# Patient Record
Sex: Female | Born: 1985
Health system: Southern US, Community
[De-identification: ages and names within clinical notes are randomized; demographics above are authoritative.]

## PROBLEM LIST (undated history)

## (undated) DIAGNOSIS — Z800 Family history of malignant neoplasm of digestive organs: Secondary | ICD-10-CM

## (undated) DIAGNOSIS — F329 Major depressive disorder, single episode, unspecified: Secondary | ICD-10-CM

## (undated) DIAGNOSIS — F32A Depression, unspecified: Secondary | ICD-10-CM

## (undated) DIAGNOSIS — Z8041 Family history of malignant neoplasm of ovary: Secondary | ICD-10-CM

## (undated) HISTORY — PX: COMBINED HYSTEROSCOPY DIAGNOSTIC / D&C: SUR297

## (undated) HISTORY — DX: Family history of malignant neoplasm of ovary: Z80.41

## (undated) HISTORY — PX: TUBAL LIGATION: SHX77

## (undated) HISTORY — PX: ABDOMINAL HYSTERECTOMY: SHX81

## (undated) HISTORY — PX: CHOLECYSTECTOMY: SHX55

## (undated) HISTORY — DX: Family history of malignant neoplasm of digestive organs: Z80.0

---
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
## 2011-07-08 ENCOUNTER — Emergency Department: Payer: Self-pay | Admitting: *Deleted

## 2011-11-20 ENCOUNTER — Observation Stay: Payer: Self-pay

## 2011-11-20 LAB — URINALYSIS, COMPLETE
Glucose,UR: 50 mg/dL (ref 0–75)
Specific Gravity: 1.019 (ref 1.003–1.030)

## 2011-11-21 LAB — URINE CULTURE

## 2011-11-29 ENCOUNTER — Inpatient Hospital Stay: Payer: Self-pay | Admitting: Obstetrics and Gynecology

## 2011-11-29 LAB — CBC WITH DIFFERENTIAL/PLATELET
Basophil #: 0.1 10*3/uL (ref 0.0–0.1)
Eosinophil #: 0.2 10*3/uL (ref 0.0–0.7)
Eosinophil %: 1.2 %
HCT: 38.3 % (ref 35.0–47.0)
HGB: 13.1 g/dL (ref 12.0–16.0)
Lymphocyte #: 1.7 10*3/uL (ref 1.0–3.6)
Lymphocyte %: 13.3 %
MCV: 86 fL (ref 80–100)
Monocyte #: 0.9 x10 3/mm (ref 0.2–0.9)
Monocyte %: 6.9 %
Neutrophil %: 78.1 %
RDW: 13.4 % (ref 11.5–14.5)
WBC: 12.5 10*3/uL — ABNORMAL HIGH (ref 3.6–11.0)

## 2011-11-30 LAB — HEMATOCRIT: HCT: 27.4 % — ABNORMAL LOW (ref 35.0–47.0)

## 2014-02-17 ENCOUNTER — Ambulatory Visit: Payer: Self-pay | Admitting: Obstetrics and Gynecology

## 2014-02-17 LAB — BASIC METABOLIC PANEL
ANION GAP: 7 (ref 7–16)
BUN: 14 mg/dL (ref 7–18)
CO2: 24 mmol/L (ref 21–32)
Calcium, Total: 8.6 mg/dL (ref 8.5–10.1)
Chloride: 106 mmol/L (ref 98–107)
Creatinine: 0.66 mg/dL (ref 0.60–1.30)
EGFR (African American): >60
Glucose: 79 mg/dL (ref 65–99)
OSMOLALITY: 273 (ref 275–301)
POTASSIUM: 3.8 mmol/L (ref 3.5–5.1)
SODIUM: 137 mmol/L (ref 136–145)

## 2014-02-17 LAB — HEMOGLOBIN: HGB: 14 g/dL (ref 12.0–16.0)

## 2014-02-23 ENCOUNTER — Ambulatory Visit: Payer: Self-pay | Admitting: Obstetrics and Gynecology

## 2014-02-23 LAB — HEMATOCRIT: HCT: 30.7 % — AB (ref 35.0–47.0)

## 2014-02-24 LAB — BASIC METABOLIC PANEL
ANION GAP: 9 (ref 7–16)
BUN: 15 mg/dL (ref 7–18)
CREATININE: 0.8 mg/dL (ref 0.60–1.30)
Calcium, Total: 7.6 mg/dL — ABNORMAL LOW (ref 8.5–10.1)
Chloride: 103 mmol/L (ref 98–107)
Co2: 25 mmol/L (ref 21–32)
EGFR (African American): >60
EGFR (Non-African Amer.): >60
Glucose: 152 mg/dL — ABNORMAL HIGH (ref 65–99)
Osmolality: 278 (ref 275–301)
Potassium: 4.4 mmol/L (ref 3.5–5.1)
Sodium: 137 mmol/L (ref 136–145)

## 2014-02-24 LAB — HEMATOCRIT: HCT: 28.1 % — ABNORMAL LOW (ref 35.0–47.0)

## 2014-02-26 LAB — PATHOLOGY REPORT

## 2014-02-27 ENCOUNTER — Inpatient Hospital Stay: Payer: Self-pay | Admitting: Obstetrics and Gynecology

## 2014-02-27 LAB — COMPREHENSIVE METABOLIC PANEL
ALBUMIN: 3.6 g/dL (ref 3.4–5.0)
ALK PHOS: 73 U/L
ANION GAP: 9 (ref 7–16)
BILIRUBIN TOTAL: 0.7 mg/dL (ref 0.2–1.0)
BUN: 8 mg/dL (ref 7–18)
CALCIUM: 8.5 mg/dL (ref 8.5–10.1)
CO2: 27 mmol/L (ref 21–32)
CREATININE: 0.68 mg/dL (ref 0.60–1.30)
Chloride: 103 mmol/L (ref 98–107)
EGFR (African American): >60
EGFR (Non-African Amer.): >60
Glucose: 125 mg/dL — ABNORMAL HIGH (ref 65–99)
OSMOLALITY: 277 (ref 275–301)
Potassium: 3.6 mmol/L (ref 3.5–5.1)
SGOT(AST): 29 U/L (ref 15–37)
SGPT (ALT): 39 U/L
Sodium: 139 mmol/L (ref 136–145)
TOTAL PROTEIN: 6.8 g/dL (ref 6.4–8.2)

## 2014-02-27 LAB — URINALYSIS, COMPLETE
BACTERIA: NONE SEEN
BLOOD: NEGATIVE
Bilirubin,UR: NEGATIVE
GLUCOSE, UR: NEGATIVE mg/dL (ref 0–75)
Ketone: NEGATIVE
LEUKOCYTE ESTERASE: NEGATIVE
NITRITE: NEGATIVE
Ph: 7 (ref 4.5–8.0)
Protein: NEGATIVE
Specific Gravity: 1.017 (ref 1.003–1.030)
Squamous Epithelial: <1
WBC UR: <1 /HPF (ref 0–5)

## 2014-02-27 LAB — CBC WITH DIFFERENTIAL/PLATELET
BASOS ABS: 0 10*3/uL (ref 0.0–0.1)
BASOS PCT: 0.4 %
Basophil #: 0 10*3/uL (ref 0.0–0.1)
Basophil %: 0.4 %
EOS ABS: 0.1 10*3/uL (ref 0.0–0.7)
EOS ABS: 0 10*3/uL (ref 0.0–0.7)
EOS PCT: 1.6 %
Eosinophil %: 0.4 %
HCT: 24.5 % — ABNORMAL LOW (ref 35.0–47.0)
HCT: 23.4 % — AB (ref 35.0–47.0)
HGB: 8.2 g/dL — AB (ref 12.0–16.0)
HGB: 8 g/dL — ABNORMAL LOW (ref 12.0–16.0)
Lymphocyte #: 0.6 10*3/uL — ABNORMAL LOW (ref 1.0–3.6)
Lymphocyte #: 1.1 10*3/uL (ref 1.0–3.6)
Lymphocyte %: 6.5 %
Lymphocyte %: 10.8 %
MCH: 29.9 pg (ref 26.0–34.0)
MCH: 30.4 pg (ref 26.0–34.0)
MCHC: 33.7 g/dL (ref 32.0–36.0)
MCHC: 34.3 g/dL (ref 32.0–36.0)
MCV: 89 fL (ref 80–100)
MCV: 88 fL (ref 80–100)
MONO ABS: 1 x10 3/mm — AB (ref 0.2–0.9)
Monocyte #: 0.8 x10 3/mm (ref 0.2–0.9)
Monocyte %: 8.4 %
Monocyte %: 9.5 %
NEUTROS PCT: 78.9 %
Neutrophil #: 7.5 10*3/uL — ABNORMAL HIGH (ref 1.4–6.5)
Neutrophil #: 8.1 10*3/uL — ABNORMAL HIGH (ref 1.4–6.5)
Neutrophil %: 83.1 %
PLATELETS: 285 10*3/uL (ref 150–440)
Platelet: 262 10*3/uL (ref 150–440)
RBC: 2.75 10*6/uL — ABNORMAL LOW (ref 3.80–5.20)
RBC: 2.65 10*6/uL — ABNORMAL LOW (ref 3.80–5.20)
RDW: 12.9 % (ref 11.5–14.5)
RDW: 12.7 % (ref 11.5–14.5)
WBC: 9.1 10*3/uL (ref 3.6–11.0)
WBC: 10.2 10*3/uL (ref 3.6–11.0)

## 2014-02-27 LAB — PROTIME-INR
INR: 1
Prothrombin Time: 13.3 secs (ref 11.5–14.7)

## 2014-02-27 LAB — APTT: ACTIVATED PTT: 26.6 s (ref 23.6–35.9)

## 2014-02-27 LAB — PHOSPHORUS: Phosphorus: 0.9 mg/dL — CL (ref 2.5–4.9)

## 2014-02-27 LAB — LIPASE, BLOOD: LIPASE: 99 U/L (ref 73–393)

## 2014-02-27 LAB — TROPONIN I: Troponin-I: <0.02 ng/mL

## 2014-02-27 LAB — MAGNESIUM: MAGNESIUM: 1.4 mg/dL — AB

## 2014-02-28 LAB — BASIC METABOLIC PANEL
ANION GAP: 4 — AB (ref 7–16)
BUN: 8 mg/dL (ref 7–18)
CALCIUM: 8.2 mg/dL — AB (ref 8.5–10.1)
CREATININE: 0.66 mg/dL (ref 0.60–1.30)
Chloride: 109 mmol/L — ABNORMAL HIGH (ref 98–107)
Co2: 26 mmol/L (ref 21–32)
Glucose: 131 mg/dL — ABNORMAL HIGH (ref 65–99)
Osmolality: 278 (ref 275–301)
Potassium: 4.2 mmol/L (ref 3.5–5.1)
Sodium: 139 mmol/L (ref 136–145)

## 2014-02-28 LAB — CBC WITH DIFFERENTIAL/PLATELET
Basophil #: 0 10*3/uL (ref 0.0–0.1)
Basophil %: 0.1 %
Eosinophil #: 0 10*3/uL (ref 0.0–0.7)
Eosinophil %: 0.1 %
HCT: 25 % — AB (ref 35.0–47.0)
HGB: 8.7 g/dL — ABNORMAL LOW (ref 12.0–16.0)
LYMPHS ABS: 0.6 10*3/uL — AB (ref 1.0–3.6)
LYMPHS PCT: 6.4 %
MCH: 30.5 pg (ref 26.0–34.0)
MCHC: 34.6 g/dL (ref 32.0–36.0)
MCV: 88 fL (ref 80–100)
MONOS PCT: 8.1 %
Monocyte #: 0.8 x10 3/mm (ref 0.2–0.9)
NEUTROS ABS: 8.7 10*3/uL — AB (ref 1.4–6.5)
Neutrophil %: 85.3 %
PLATELETS: 277 10*3/uL (ref 150–440)
RBC: 2.84 10*6/uL — AB (ref 3.80–5.20)
RDW: 13.1 % (ref 11.5–14.5)
WBC: 10.2 10*3/uL (ref 3.6–11.0)

## 2014-02-28 LAB — PHOSPHORUS: Phosphorus: 2.7 mg/dL (ref 2.5–4.9)

## 2014-02-28 LAB — MAGNESIUM: Magnesium: 2.1 mg/dL

## 2014-03-01 LAB — BASIC METABOLIC PANEL
Anion Gap: 6 — ABNORMAL LOW (ref 7–16)
BUN: 10 mg/dL (ref 7–18)
Calcium, Total: 7.9 mg/dL — ABNORMAL LOW (ref 8.5–10.1)
Chloride: 108 mmol/L — ABNORMAL HIGH (ref 98–107)
Co2: 27 mmol/L (ref 21–32)
Creatinine: 0.75 mg/dL (ref 0.60–1.30)
EGFR (Non-African Amer.): >60
Glucose: 96 mg/dL (ref 65–99)
Osmolality: 280 (ref 275–301)
Potassium: 4 mmol/L (ref 3.5–5.1)
Sodium: 141 mmol/L (ref 136–145)

## 2014-03-01 LAB — CBC WITH DIFFERENTIAL/PLATELET
Basophil #: 0 10*3/uL (ref 0.0–0.1)
Basophil %: 0.5 %
Eosinophil #: 0.5 10*3/uL (ref 0.0–0.7)
Eosinophil %: 5.8 %
HCT: 23.3 % — ABNORMAL LOW (ref 35.0–47.0)
HGB: 7.9 g/dL — ABNORMAL LOW (ref 12.0–16.0)
Lymphocyte #: 2.1 10*3/uL (ref 1.0–3.6)
Lymphocyte %: 24.8 %
MCH: 30.3 pg (ref 26.0–34.0)
MCHC: 34.1 g/dL (ref 32.0–36.0)
MCV: 89 fL (ref 80–100)
MONOS PCT: 10.4 %
Monocyte #: 0.9 x10 3/mm (ref 0.2–0.9)
NEUTROS ABS: 5 10*3/uL (ref 1.4–6.5)
Neutrophil %: 58.5 %
Platelet: 252 10*3/uL (ref 150–440)
RBC: 2.62 10*6/uL — AB (ref 3.80–5.20)
RDW: 13.6 % (ref 11.5–14.5)
WBC: 8.6 10*3/uL (ref 3.6–11.0)

## 2014-03-01 LAB — URINE CULTURE

## 2014-03-04 LAB — CULTURE, BLOOD (SINGLE)

## 2014-11-21 NOTE — Op Note (Signed)
PATIENT NAME:  Sharon Andrade, Sharon Andrade MR#:  [id number] DATE OF BIRTH:  Oct 05, 1985  DATE OF PROCEDURE:  02/27/2014  PREOPERATIVE DIAGNOSES:  Postoperative hemoperitoneum.   POSTOPERATIVE DIAGNOSES:  Postoperative hemoperitoneum.   OPERATION PERFORMED: Diagnostic laparoscopy evacuation of hemoperitoneum and the administration of hemostatic agent along prior hysterectomy pedicles.   ANESTHESIA USED: General.   PRIMARY SURGEON: Vena Austria, MD.   ESTIMATED BLOOD LOSS: Minimal blood loss intraoperatively but evacuation of 750 mL of hemoperitoneum.  OPERATIVE FLUIDS: 1 liter of crystalloid.   COMPLICATIONS: None.   PREOPERATIVE ANTIBIOTICS: None.   INTRAOPERATIVE FINDINGS: Large amount of hemoperitoneum noted within the pelvis. This appeared to be old and was already coagulated.  Following evacuation of the hemoperitoneum, the vaginal cuff was inspected both vaginally and abdominally and noted to be intact with no active bleeding, and also no signs of infection. The pedicles on the left and right sidewall were inspected and also noted to be hemostatic. The pelvis was copiously irrigated and 1 gram of Arista powder was applied to all pedicles to assure continued hemostasis.   SPECIMENS REMOVED: None.   CONDITION FOLLOWING PROCEDURE: Stable.   PROCEDURE IN DETAIL: Risks, benefits, and alternatives of the procedure were discussed with the patient prior to proceeding to the operating room. The patient had initially presented with tachycardia and fever to 103 to the Emergency Department 4 days following uncomplicated vaginal hysterectomy.  She had not noticed any bleeding vaginally, but CT scan of the abdomen and pelvis was consistent with hemoperitoneum and could not exclude active bleeding.  After  discussion of management and options, the patient initially opted for conservative management with serial CBCs; however, her 2nd CBC was unable to drawn and there was some concern that she was clamping  down peripherally and did have active bleeding which prompted proceeding to the OR for evaluation of the pedicles. Upon taking the patient to the OR, she was placed under general endotracheal anesthesia, prepped and draped in the usual sterile fashion and positioned in the dorsal lithotomy position in [place] stirrups. A timeout was performed.   Attention was turned to the patient's pelvis. The bladder was already catheterized with an indwelling Foley catheter. An operative speculum was placed and the cervical cuff was inspected vaginally, noted to be intact with no evidence of purulent drainage or bleeding. A sponge stick was then placed in the vagina to allow manipulation of the vaginal cuff when doing the inspection for the abdominal side.   Attention was turned to the patient's abdomen. The left upper quadrant was injected with 0.5% Sensorcaine at Palmer's point. A stab incision was made with the scalpel and a 5 mm XL trocar was used to gain entry into the peritoneal cavity under direct visualization. Pneumoperitoneum was established. A second 5 mm assistant port was placed in the left lower quadrant followed by a 5 mm umbilical trocar.  A suction irrigator was then used to evacuate the previously noted hemoperitoneum on CT scan. The blood was noted to be old and previously coagulated. There was no active bleeding visualized at any point during the case.   Upon evacuation of the hemoperitoneum, all pedicles were closely inspected. The ureters were traced through the course and the pelvis noted to be peristalsing appropriately. Even though there was no active bleeding from the cuff or the pedicles, 1 gram of Arista powder was applied after copiously irrigating the pelvis with normal saline. Pneumoperitoneum was then evacuated and the trocars were removed under direct visualization. The incision sites were  dressed with Dermabond. Sponge, needle, and instrument counts were correct x2. The patient tolerated the  procedure well and was taken to the recovery room in stable condition.     ____________________________ Florina OuAndreas M. Bonney AidStaebler, MD ams:ds D: 03/03/2014 20:10:44 ET T: 03/03/2014 20:45:45 ET JOB#: [id number]  cc: Florina OuAndreas M. Bonney AidStaebler, MD, <Dictator> Carmel SacramentoANDREAS Cathrine MusterM Yair Dusza MD ELECTRONICALLY SIGNED 03/12/2014 11:41

## 2014-11-21 NOTE — Consult Note (Signed)
Consulting Department: Emergency Department Consulting Physician: Loleta RoseForbach, Cory MD  Consulting Question: Postop fever  History of Present Illness: 29 year old [phone] presenting postoperative day 4 following uncomplicated vaginal hysterectomy secondary for previously failed endometrial ablation with fevers and generalized malaise.  States started feeling off yesterday, but this morning awoke with fevers, chills, suprapubic pain and pressure worse with voiding.  She has not noted any associated change in urine color, odor, or clarity, specifically denies hematuria.  No rectal pressure.  Denies nausea or emesis, stooling normally.  Reportedly febrile up to 102.  Has not noted any foul smelling vaginal discharge, increased bleeding. No CP, no SOB  Review of Systems: 10 point review of systems negative unless note in HPI Medical History:DepressionPreeclampsia  Past Surgical History: Total vaginal hysterectomy 07/27/2015Novasure endometrial ablation 2013D&C possible prior salpingectomy secondary to ectopicBTL 11/30/2012  Obstetric History : [id number], reports several 1st trimester SAB's and 1 ectopic, two term vaginal delivereis  Gynecologic History: denies history of STI, s/p vaginal hysterectomy as detailed above  Family History: non-contributory  Social History: Denies tobacco, EtOH, or illicit drug use  Allergies: PCN  Medications:Colace 100mg  po bidNaprosyn 500mg  po q12hrs prn painNorco 5/325 1 tab po q4-hrs prn pain  Physical Exam:98.6; BP 117/77; HR 151, RR 18, O2sat 100% RANADnomocephalic, anictericRegular rate and rythmCTABNABS, soft, non-distendended, moderate suprapubic tenderness, no rebound, no guardingdeferredno edema, erythemaCN II-XII grossly intactmood appropriate, affect full WBC 9.1K, H&H 8.2 & 24.5, platelets 262KNa 139, K 3.6, Cl 103, CO2 27, BUN 8, Cr 0.68, BG 125, AT 29, ALT 39, Mg 1.499Acid 1.8pH 7.53, PCO2 30, PO2 101pending  Assessment: 29 yo [id number] POD#4 TVH presenting  with likely sepsis  Plan:Sepsis - suspect urinary in origin vs vaginal cuff hematoma/abscess.  Normal BUN/Cr, WBC, and exam reassuring making bowl injury or urinary tract injury unlikleygiven PCN allergy will cover with aztreonam and clindamycinBlood and urine cultures pendingCT scan abdomen and pelvis pending FEN - regular diet, D5 1/2NS at 16250mL/hr Pain control - percocet & ibuprofen prn DVT ppx - SCD's Disposition - pending 24-hr afebrile, results of cultures and imaging will determine type and duration of po regimen   Electronic Signatures: Lorrene ReidStaebler, Yareth Macdonnell M (MD) (Signed on 31-Jul-15 17:11)  Authored   Last Updated: 31-Jul-15 17:51 by Lorrene ReidStaebler, Ankush Gintz M (MD)

## 2014-11-21 NOTE — Op Note (Signed)
PATIENT NAME:  Sharon Andrade, Sharon MR#:  [id number] DATE OF BIRTH:  1986-03-07  DATE OF PROCEDURE:  02/23/2014  PREOPERATIVE DIAGNOSIS: Menorrhagia, failed NovaSure endometrial ablation.   POSTOPERATIVE DIAGNOSIS: Menorrhagia, failed NovaSure endometrial ablation.   PROCEDURE: 1.  Total vaginal hysterectomy.  2.  Left distal salpingectomy.  ANESTHESIA: General endotracheal anesthesia.  SURGEON: Suzy Bouchardhomas J. Schermerhorn, MD  INDICATIONS: This is a 29 year old gravida 7, para 2. Patient underwent a failed NovaSure ablation in 2013, continues to have heavy menorrhagia and dysmenorrhea.   PROCEDURE: After adequate general endotracheal anesthesia, the patient was placed in the dorsal supine position with the legs in the candy-cane stirrups. The patient was prepped and draped in sterile fashion. She did receive 2 g IV cefoxitin prior to commencement of the case. The patient's bladder was catheterized, yielding 250 mL of clear urine. A weighted speculum was placed in the posterior vaginal vault and the cervix was grasped with 2 thyroid tenacula. The cervix was circumferentially injected with 1% lidocaine with 1:100,000 epinephrine. A direct posterior colpotomy incision was made. Upon entry into the posterior cul-de-sac, a long-billed weighted speculum was placed. The uterosacral ligaments were bilaterally clamped, transected, suture ligated with 0 Vicryl suture. Bovie was used to circumferentially incise the anterior portion of the cervix. The anterior cul-de-sac was entered sharply without difficulty and the Deaver retractor was placed within the anterior cul-de-sac to reflect the bladder anteriorly. The cardinal ligaments were bilaterally clamped, transected, suture ligated with 0 Vicryl suture. The uterine arteries were bilaterally clamped, transected, suture ligated with 0 Vicryl suture. Sequential clamps in the broad ligament were performed, and the cornua were ultimately bilaterally clamped, transected,  doubly ligated with 0 Vicryl suture. Good hemostasis was noted. Ovaries appeared normal. There was no identifiable right fallopian tube. Left fallopian tube was identified with a distal portion approximately 1.5 cm. This was clamped, removed, and suture ligated. After assuring good hemostasis, the peritoneum was closed with a pursestring 2-0 PDS suture and the cuff was then closed with a running 0 Vicryl suture. Good approximation of tissue, good hemostasis noted. Sponge and needle count were correct. There were no complications. Estimated blood loss 50 mL. Intraoperative fluids 800 mL. Foley catheter was placed at the end of the case. Urine output prior was 250 mL. Patient was taken to the recovery room in good condition.    ___________________________ Suzy Bouchardhomas J. Schermerhorn, MD tjs:sk D: 02/23/2014 11:55:35 ET T: 02/23/2014 21:35:07 ET JOB#: [id number]  cc: Suzy Bouchardhomas J. Schermerhorn, MD, <Dictator> Suzy BouchardHOMAS J SCHERMERHORN MD ELECTRONICALLY SIGNED 03/02/2014 18:27

## 2014-11-22 NOTE — Op Note (Signed)
PATIENT NAME:  Sharon SitesJOHNSON, [name]#:  [id number] DATE OF BIRTH:  25-Oct-1985  DATE OF PROCEDURE:  12/01/2011  PREOPERATIVE DIAGNOSIS: Elective permanent sterilization.   POSTOPERATIVE DIAGNOSIS: Elective permanent sterilization.   PROCEDURE: Bilateral tubal ligation, [place].   SURGEON:  Beverly Gust. Talya Quain, MD  ANESTHESIA:  General endotracheal anesthesia.  INDICATIONS: This is a 29 year old gravida 2, now para 2 patient with uncomplicated spontaneous vaginal delivery the day before. The patient has elected for permanent sterilization. Medicaid consent form was signed by the patient on 10/31/2011. The patient reconfirms the desire for permanent sterilization. She is aware of the failure rate of one per 200 per year.   PROCEDURE: After adequate general endotracheal anesthesia, the patient was placed in the dorsal supine position. The patient's abdomen was prepped and draped in normal sterile fashion. The skin was injected with 0.5% Marcaine without epinephrine. A 15-mm infraumbilical incision was made. Sharp dissection was used to identify the fascia. The fascia was opened sharply and peritoneum was opened sharply. The right fallopian tube was grasped with a Babcock clamp and after visualizing the fimbriated end, two separate 0 plain gut sutures were applied in the mid portion of the fallopian tube. A 1.5-cm portion of the fallopian tube was removed. Good hemostasis was noted. A similar procedure was repeated on the patient's left fallopian tube. After visualizing the fimbriated end, two separate 0 plain gut sutures were applied. A 1.5-cm portion of the fallopian tube was removed. Good hemostasis was noted. The patient was flattened out and the fascia was closed with a 2-0 Vicryl suture running, nonlocking, with good approximation of edges. The skin was reapproximated with interrupted 4-0 Vicryl suture. Sterile dressing applied. No complications. Estimated blood loss minimal. The patient was taken to the  recovery room in good condition.  ____________________________ [name], MD tjs:bjt D:  12/01/2011 10:14:54 ET          T: 12/01/2011 11:18:16 ET         JOB#: [id number] Suzy BouchardHOMAS J Raejean Swinford MD ELECTRONICALLY SIGNED 12/04/2011 9:42

## 2014-12-08 NOTE — H&P (Signed)
L&D Evaluation:  History:   HPI 29yo G2P1001 with LMP of 03/02/11 & EDD of 12/28/11 here for "PPROM of clear fluid at 0630am leaking from vagina this am" Fluid visualized clear with exam of 3/90/vtx-2.  [facility]x of pre-[facility]cl[facility], pp hemorrhage, perineal repair in the OR after last delivery due to blood transfusion, Obese, depression hx, Wheezing hx in the past with Albuterol inhaler. Pt was offerred consideration for a LTCS due to extensive pp repair and hemorrhage and blood transfusion but, disc with pt and partner and they have elected due to pt being at risk for the same issue.    Presents with leaking fluid    Patient's Medical History Depression, Obese, PP hemorrhage with blood transfusion    Patient's Surgical History Perineal repair in the OR, cholestectomy    Medications Pre Natal Vitamins    Allergies PCN    Social History none    Family History Non-Contributory   ROS:   ROS All systems were reviewed.  HEENT, CNS, GI, GU, Respiratory, CV, Renal and Musculoskeletal systems were found to be normal.   Exam:   General no apparent distress    Mental Status clear    Chest clear    Heart normal sinus rhythm, no murmur/gallop/rubs    Abdomen gravid, non-tender    Estimated Fetal Weight Average for gestational age    Fetal Position vtx    Back no CVAT    Edema 1+    Reflexes 1+    Clonus negative    Pelvic 3/100/vtx-2    Mebranes Ruptured, PPROM    Description clear    FHT normal rate with no decels, accels present    Ucx absent    Skin dry    Lymph no lymphadenopathy   Impression:   Impression IUP nat 35 5/7 weeks with PPROM   Plan:   Plan antibiotics for GBBS prophylaxis, Admit for delivery.    Comments Disc with Dr Logan BoresEvans and Dr. Feliberto GottronSchermerhorn regarding IOL with Pitocin for a vaginal delivery vs offerring LTCS due to previous hx of pp hemorrhage and perineal repair in the OR. Pt accepts the risks, benefits and alternatives and  is ok to proceed for a vaginal delivery. Disc Pitocin vs waiting for labor S/S and pt consents for labor induction. Will start Pitocin per protocol.   Electronic Signatures: Sharee PimpleJones, Avyonna Wagoner W (CNM)  (Signed 01-May-13 10:56)  Authored: L&D Evaluation   Last Updated: 01-May-13 10:56 by Sharee PimpleJones, Takyah Ciaramitaro W (CNM)

## 2017-12-02 ENCOUNTER — Encounter: Payer: Self-pay | Admitting: Family Medicine

## 2017-12-03 NOTE — Telephone Encounter (Signed)
Rosey Bath, could you cancel and reschedule the patient's appt? Thanks!

## 2017-12-03 NOTE — Telephone Encounter (Signed)
Contacted pt and pt is rescheduled for new pt visit on Tuesday 02/12/18. Thanks TNP

## 2017-12-14 ENCOUNTER — Ambulatory Visit: Payer: Self-pay | Admitting: Family Medicine

## 2018-02-10 ENCOUNTER — Encounter: Payer: Self-pay | Admitting: Family Medicine

## 2018-02-12 ENCOUNTER — Ambulatory Visit: Payer: Self-pay | Admitting: Family Medicine

## 2018-03-17 DIAGNOSIS — S63641A Sprain of metacarpophalangeal joint of right thumb, initial encounter: Secondary | ICD-10-CM | POA: Diagnosis not present

## 2018-03-17 DIAGNOSIS — S60011A Contusion of right thumb without damage to nail, initial encounter: Secondary | ICD-10-CM | POA: Diagnosis not present

## 2018-03-17 DIAGNOSIS — M79644 Pain in right finger(s): Secondary | ICD-10-CM | POA: Diagnosis not present

## 2018-05-22 ENCOUNTER — Ambulatory Visit: Payer: Self-pay | Admitting: Family Medicine

## 2018-06-06 ENCOUNTER — Encounter: Payer: Self-pay | Admitting: Family Medicine

## 2018-06-06 ENCOUNTER — Other Ambulatory Visit (HOSPITAL_COMMUNITY)
Admission: RE | Admit: 2018-06-06 | Discharge: 2018-06-06 | Disposition: A | Discharge status: Home / Self Care | Payer: 59 | Source: Ambulatory Visit | Attending: Family Medicine | Admitting: Family Medicine

## 2018-06-06 ENCOUNTER — Ambulatory Visit: Payer: 59 | Admitting: Family Medicine

## 2018-06-06 VITALS — BP 118/68 | HR 100 | Temp 98.0°F | Resp 16 | Ht 67.0 in | Wt 202.5 lb

## 2018-06-06 DIAGNOSIS — Z113 Encounter for screening for infections with a predominantly sexual mode of transmission: Secondary | ICD-10-CM | POA: Diagnosis not present

## 2018-06-06 DIAGNOSIS — N939 Abnormal uterine and vaginal bleeding, unspecified: Secondary | ICD-10-CM | POA: Diagnosis not present

## 2018-06-06 DIAGNOSIS — Z1159 Encounter for screening for other viral diseases: Secondary | ICD-10-CM

## 2018-06-06 DIAGNOSIS — Z915 Personal history of self-harm: Secondary | ICD-10-CM | POA: Diagnosis not present

## 2018-06-06 DIAGNOSIS — Z9151 Personal history of suicidal behavior: Secondary | ICD-10-CM

## 2018-06-06 DIAGNOSIS — Z6831 Body mass index (BMI) 31.0-31.9, adult: Secondary | ICD-10-CM | POA: Diagnosis not present

## 2018-06-06 DIAGNOSIS — Z114 Encounter for screening for human immunodeficiency virus [HIV]: Secondary | ICD-10-CM

## 2018-06-06 DIAGNOSIS — E6609 Other obesity due to excess calories: Secondary | ICD-10-CM

## 2018-06-06 DIAGNOSIS — T1490XA Injury, unspecified, initial encounter: Secondary | ICD-10-CM

## 2018-06-06 DIAGNOSIS — Z13220 Encounter for screening for lipoid disorders: Secondary | ICD-10-CM

## 2018-06-06 NOTE — Patient Instructions (Signed)
Lorcaserin extended-release tablets What is this medicine? LORCASERIN (lor ca SER in) is used to promote and maintain weight loss in obese patients. This medicine should be used with a reduced calorie diet and, if appropriate, an exercise program. This medicine may be used for other purposes; ask your health care provider or pharmacist if you have questions. COMMON BRAND NAME(S): Belviq XR What should I tell my health care provider before I take this medicine? They need to know if you have any of these conditions: -anatomical deformation of the penis, Peyronie's disease, or history of priapism (painful and prolonged erection) -diabetes -heart disease -history of blood diseases, like sickle cell anemia or leukemia -history of irregular heartbeat -kidney disease -liver disease -suicidal thoughts, plans, or attempt; a previous suicide attempt by you or a family member -an unusual or allergic reaction to lorcaserin, other medicines, foods, dyes, or preservatives -pregnant or trying to get pregnant -breast-feeding How should I use this medicine? Take this medicine by mouth with a glass of water. Follow the directions on the prescription label. Do not cut, crush or chew this medicine. You can take it with or without food. Take your medicine at regular intervals. Do not take it more often than directed. Do not stop taking except on your doctor's advice. Talk to your pediatrician regarding the use of this medicine in children. Special care may be needed. Overdosage: If you think you have taken too much of this medicine contact a poison control center or emergency room at once. NOTE: This medicine is only for you. Do not share this medicine with others. What if I miss a dose? If you miss a dose, take it as soon as you can. If it is almost time for your next dose, take only that dose. Do not take double or extra doses. What may interact with this medicine? -cabergoline -certain medicines for  depression, anxiety, or psychotic disturbances -certain medicines for erectile dysfunction -certain medicines for migraine headache like almotriptan, eletriptan, frovatriptan, naratriptan, rizatriptan, sumatriptan, zolmitriptan -dextromethorphan -linezolid -lithium -medicines for diabetes -other weight loss products -tramadol -St. John's Wort -stimulant medicines for attention disorders, weight loss, or to stay awake -tryptophan This list may not describe all possible interactions. Give your health care provider a list of all the medicines, herbs, non-prescription drugs, or dietary supplements you use. Also tell them if you smoke, drink alcohol, or use illegal drugs. Some items may interact with your medicine. What should I watch for while using this medicine? This medicine is intended to be used in addition to a healthy diet and appropriate exercise. The best results are achieved this way. Your doctor should instruct you to stop using this medicine if you do not lose a certain amount of weight within the first 12 weeks of treatment, but it is important that you do not change your dose in any way without consulting your doctor or health care professional. Visit your doctor or health care professional for regular checkups. Your doctor may order blood tests or other tests to see how you are doing. Do not drive, use machinery, or do anything that needs mental alertness until you know how this medicine affects you. This medicine may affect blood sugar levels. If you have diabetes, check with your doctor or health care professional before you change your diet or the dose of your diabetic medicine. Patients and their families should watch out for worsening depression or thoughts of suicide. Also watch out for sudden changes in feelings such as feeling anxious,  agitated, panicky, irritable, hostile, aggressive, impulsive, severely restless, overly excited and hyperactive, or not being able to sleep. If  this happens, especially at the beginning of treatment or after a change in dose, call your health care professional. Contact your doctor or health care professional right away if you are a man with an erection that lasts longer than 4 hours or if the erection becomes painful. This may be a sign of serious problem and must be treated right away to prevent permanent damage. What side effects may I notice from receiving this medicine? Side effects that you should report to your doctor or health care professional as soon as possible: -allergic reactions like skin rash, itching or hives, swelling of the face, lips, or tongue -abnormal production of milk -breast enlargement in both males and females -breathing problems -changes in emotions or moods -changes in vision -confusion -erection lasting more than 4 hours or a painful erection -fast or irregular heart beat -feeling faint or lightheaded, falls -fever or chills, sore throat -hallucination, loss of contact with reality -high or low blood pressure -menstrual changes -restlessness -signs and symptoms of low blood sugar such as feeling anxious; confusion; dizziness; increased hunger; unusually weak or tired; sweating; shakiness; cold; irritable; headache; blurred vision; fast heartbeat; loss of consciousness -slow or irregular heartbeat -stiff muscles -sweating -suicidal thoughts or actions -swelling of the ankles, feet, hands -unusually weak or tired -vomiting Side effects that usually do not require medical attention (report to your doctor or health care professional if they continue or are bothersome): -back pain -constipation -cough -dry mouth -nausea -tiredness This list may not describe all possible side effects. Call your doctor for medical advice about side effects. You may report side effects to FDA at 1-800-FDA-1088. Where should I keep my medicine? Keep out of the reach of children. This medicine can be abused. Keep your  medicine in a safe place to protect it from theft. Do not share this medicine with anyone. Selling or giving away this medicine is dangerous and against the law. Store at room temperature between 15 and 30 degrees C (59 and 86 degrees F). Throw away any unused medicine after the expiration date. NOTE: This sheet is a summary. It may not cover all possible information. If you have questions about this medicine, talk to your doctor, pharmacist, or health care provider.  2018 Elsevier/Gold Standard (2015-08-18 16:24:54) Liraglutide injection (Weight Management) What is this medicine? LIRAGLUTIDE (LIR a GLOO tide) is used with a reduced calorie diet and exercise to help you lose weight. This medicine may be used for other purposes; ask your health care provider or pharmacist if you have questions. COMMON BRAND NAME(S): Saxenda What should I tell my health care provider before I take this medicine? They need to know if you have any of these conditions: -endocrine tumors (MEN 2) or if someone in your family had these tumors -gallbladder disease -high cholesterol -history of alcohol abuse problem -history of pancreatitis -kidney disease or if you are on dialysis -liver disease -previous swelling of the tongue, face, or lips with difficulty breathing, difficulty swallowing, hoarseness, or tightening of the throat -stomach problems -suicidal thoughts, plans, or attempt; a previous suicide attempt by you or a family member -thyroid cancer or if someone in your family had thyroid cancer -an unusual or allergic reaction to liraglutide, other medicines, foods, dyes, or preservatives -pregnant or trying to get pregnant -breast-feeding How should I use this medicine? This medicine is for injection under the skin of  your upper leg, stomach area, or upper arm. You will be taught how to prepare and give this medicine. Use exactly as directed. Take your medicine at regular intervals. Do not take it more  often than directed. It is important that you put your used needles and syringes in a special sharps container. Do not put them in a trash can. If you do not have a sharps container, call your pharmacist or healthcare provider to get one. A special MedGuide will be given to you by the pharmacist with each prescription and refill. Be sure to read this information carefully each time. Talk to your pediatrician regarding the use of this medicine in children. Special care may be needed. Overdosage: If you think you have taken too much of this medicine contact a poison control center or emergency room at once. NOTE: This medicine is only for you. Do not share this medicine with others. What if I miss a dose? If you miss a dose, take it as soon as you can. If it is almost time for your next dose, take only that dose. Do not take double or extra doses. If you miss your dose for 3 days or more, call your doctor or health care professional to talk about how to restart this medicine. What may interact with this medicine? -insulin and other medicines for diabetes This list may not describe all possible interactions. Give your health care provider a list of all the medicines, herbs, non-prescription drugs, or dietary supplements you use. Also tell them if you smoke, drink alcohol, or use illegal drugs. Some items may interact with your medicine. What should I watch for while using this medicine? Visit your doctor or health care professional for regular checks on your progress. This medicine is intended to be used in addition to a healthy diet and appropriate exercise. The best results are achieved this way. Do not increase or in any way change your dose without consulting your doctor or health care professional. Drink plenty of fluids while taking this medicine. Check with your doctor or health care professional if you get an attack of severe diarrhea, nausea, and vomiting. The loss of too much body fluid can make  it dangerous for you to take this medicine. This medicine may affect blood sugar levels. If you have diabetes, check with your doctor or health care professional before you change your diet or the dose of your diabetic medicine. Patients and their families should watch out for worsening depression or thoughts of suicide. Also watch out for sudden changes in feelings such as feeling anxious, agitated, panicky, irritable, hostile, aggressive, impulsive, severely restless, overly excited and hyperactive, or not being able to sleep. If this happens, especially at the beginning of treatment or after a change in dose, call your health care professional. What side effects may I notice from receiving this medicine? Side effects that you should report to your doctor or health care professional as soon as possible: -allergic reactions like skin rash, itching or hives, swelling of the face, lips, or tongue -breathing problems -diarrhea that continues or is severe -lump or swelling on the neck -severe nausea -signs and symptoms of infection like fever or chills; cough; sore throat; pain or trouble passing urine -signs and symptoms of low blood sugar such as feeling anxious, confusion, dizziness, increased hunger, unusually weak or tired, sweating, shakiness, cold, irritable, headache, blurred vision, fast heartbeat, loss of consciousness -signs and symptoms of kidney injury like trouble passing urine or change in  the amount of urine -trouble swallowing -unusual stomach upset or pain -vomiting Side effects that usually do not require medical attention (report to your doctor or health care professional if they continue or are bothersome): -constipation -decreased appetite -diarrhea -fatigue -headache -nausea -pain, redness, or irritation at site where injected -stomach upset -stuffy or runny nose This list may not describe all possible side effects. Call your doctor for medical advice about side effects.  You may report side effects to FDA at 1-800-FDA-1088. Where should I keep my medicine? Keep out of the reach of children. Store unopened pen in a refrigerator between 2 and 8 degrees C (36 and 46 degrees F). Do not freeze or use if the medicine has been frozen. Protect from light and excessive heat. After you first use the pen, it can be stored at room temperature between 15 and 30 degrees C (59 and 86 degrees F) or in a refrigerator. Throw away your used pen after 30 days or after the expiration date, whichever comes first. Do not store your pen with the needle attached. If the needle is left on, medicine may leak from the pen. NOTE: This sheet is a summary. It may not cover all possible information. If you have questions about this medicine, talk to your doctor, pharmacist, or health care provider.  2018 Elsevier/Gold Standard (2016-08-03 14:41:37)

## 2018-06-06 NOTE — Progress Notes (Addendum)
Name: Sharon Andrade   MRN: [id number]    DOB: 1986-01-29   Date:06/06/2018       Progress Note  Subjective  Chief Complaint  Chief Complaint  Patient presents with  . Establish Care  . Vaginal Bleeding    had hysterectomy 2015  for 1 year    HPI  Pt presents to establish care and for the following concerns:  Vaginal bleeding: She had hysterectomy in 2015 after several years of trying to control heavy vaginal bleeding.  She had total hysterectomy in July 2015, had to return to the OR for hematoma 7 days later.  She has been having vaginal bleeding for about 10-11 months - is intermittent - she does have to wear a pad.  Does have ongoing RIGHT low pelvic pain that is intermittent.  No blood in urine.  She has ongoing white vaginal discharge that is in an amount that causes her to wear a pad daily.  She is unsure if she had oophorectomy.  She is having to take medication for constipation for a few years - since hysterectomy - taking Mag07 as needed.  Obesity: Body mass index is 31.72 kg/m. Weight Management History: Diet: Trying Atkins shakes, trying to do this for lunch; grilled chicken salad for dinners Exercise: moderately active - 100 sit ups, 50 squats, walking every day - works at [employer]. Co-Morbid Conditions: none; 2 or more of these conditions combined with BMI >30 is considered morbid obesity; is this diagnosis appropriate and/or added to patient's problem list? No - No family or personal history of thyroid cancer; no personal history of pancreatitis. - Discussed Belviq and Bernie Covey - she is interested in [place].  We will check labs today and have her see GYN.   Childhood Trauma: She found her grandfather after he shot himself when she was 3yo.  Her mother gave her up at age 18, father put her into foster care at age 12.  She has history of suicide attempt as a child/adolescent.  Has been doing well as an adult.  Patient Active Problem List   Diagnosis Date  Noted  . Abnormal vaginal bleeding 06/06/2018  . Trauma in childhood 06/06/2018   History reviewed. No pertinent surgical history.  Family History  Problem Relation Age of Onset  . COPD Mother        smoker  . Emphysema Father        smoker  . COPD Maternal Grandmother        smoker  . Pancreatic cancer Maternal Grandfather        runs in family  . Depression Paternal Grandfather        Shot himself; pt found him when she was 32yo    Social History   Socioeconomic History  . Marital status: Married    Spouse name: Caryn Bee  . Number of children: 2  . Years of education: Not on file  . Highest education level: Not on file  Occupational History  . Not on file  Social Needs  . Financial resource strain: Not hard at all  . Food insecurity:    Worry: Never true    Inability: Never true  . Transportation needs:    Medical: No    Non-medical: No  Tobacco Use  . Smoking status: Never Smoker  . Smokeless tobacco: Never Used  Substance and Sexual Activity  . Alcohol use: Never    Frequency: Never  . Drug use: Never  . Sexual activity: Yes  Partners: Male    Birth control/protection: Surgical  Lifestyle  . Physical activity:    Days per week: 3 days    Minutes per session: 30 min  . Stress: Not at all  Relationships  . Social connections:    Talks on phone: More than three times a week    Gets together: More than three times a week    Attends religious service: Never    Active member of club or organization: No    Attends meetings of clubs or organizations: Never    Relationship status: Married  . Intimate partner violence:    Fear of current or ex partner: No    Emotionally abused: No    Physically abused: No    Forced sexual activity: No  Other Topics Concern  . Not on file  Social History Narrative   32yo and 32yo; married to her husband     Current Outpatient Medications:  Marland Kitchen  Multiple Vitamin (MULTIVITAMIN) tablet, Take 1 tablet by mouth daily., Disp: ,  Rfl:   Allergies not on file  I personally reviewed active problem list, medication list, allergies, family history, social history, health maintenance, notes from last encounter, lab results with the patient/caregiver today.   ROS Ten systems reviewed and is negative except as mentioned in HPI.  Objective  Vitals:   06/06/18 1322  BP: 118/68  Pulse: 100  Resp: 16  Temp: 98 F (36.7 C)  TempSrc: Oral  SpO2: 99%  Weight: 202 lb 8 oz (91.9 kg)  Height: 5\' 7"  (1.702 m)   Body mass index is 31.72 kg/m.  Physical Exam  Constitutional: Patient appears well-developed and well-nourished. No distress.  HENT: Head: Normocephalic and atraumatic.  Neck: Normal range of motion. Neck supple. No JVD present. No thyromegaly present.  Cardiovascular: Normal rate, regular rhythm and normal heart sounds.  No murmur heard. No BLE edema. Pulmonary/Chest: Effort normal and breath sounds normal. No respiratory distress. Abdominal: Soft. Bowel sounds are normal, no distension. There is no tenderness. no masses FEMALE GENITALIA:  External genitalia normal External urethra normal Vaginal vault normal without discharge or lesions Cervix not visualized Bimanual exam normal without masses RECTAL: Deferred Musculoskeletal: Normal range of motion, no joint effusions. No gross deformities Neurological: he is alert and oriented to person, place, and time. No cranial nerve deficit. Coordination, balance, strength, speech and gait are normal.  Skin: Skin is warm and dry. No rash noted. No erythema.  Psychiatric: Patient has a normal mood and affect. behavior is normal. Judgment and thought content normal.  No results found for this or any previous visit (from the past 72 hour(s)).   PHQ2/9: Depression screen PHQ 2/9 06/06/2018  Decreased Interest 0  Down, Depressed, Hopeless 0  PHQ - 2 Score 0  Altered sleeping 0  Tired, decreased energy 0  Change in appetite 0  Feeling bad or failure about  yourself  0  Trouble concentrating 0  Moving slowly or fidgety/restless 0  Suicidal thoughts 0  PHQ-9 Score 0  Difficult doing work/chores Not difficult at all   Fall Risk: Fall Risk  06/06/2018  Falls in the past year? 0   Assessment & Plan  1. Abnormal vaginal bleeding - Cytology - PAP - CBC  2. Trauma in childhood - Stable  3. History of attempted suicide - Stable  4. Class 1 obesity due to excess calories without serious comorbidity with body mass index (BMI) of 31.0 to 31.9 in adult - COMPLETE METABOLIC PANEL WITH GFR -  TSH  5. Lipid screening - Lipid panel  6. Encounter for screening for HIV - HIV Antibody (routine testing w rflx)  7. Routine screening for STI (sexually transmitted infection) - Cytology - PAP - HIV Antibody (routine testing w rflx) - Hepatitis C antibody  8. Need for hepatitis C screening test - Hepatitis C antibody

## 2018-06-07 LAB — COMPLETE METABOLIC PANEL WITH GFR
AG RATIO: 1.9 (calc) (ref 1.0–2.5)
ALBUMIN MSPROF: 4.3 g/dL (ref 3.6–5.1)
ALKALINE PHOSPHATASE (APISO): 66 U/L (ref 33–115)
ALT: 19 U/L (ref 6–29)
AST: 16 U/L (ref 10–30)
BUN: 20 mg/dL (ref 7–25)
CO2: 28 mmol/L (ref 20–32)
CREATININE: 0.78 mg/dL (ref 0.50–1.10)
Calcium: 9.3 mg/dL (ref 8.6–10.2)
Chloride: 103 mmol/L (ref 98–110)
GFR, Est African American: 115 mL/min/{1.73_m2} (ref >=60)
GFR, Est Non African American: 99 mL/min/{1.73_m2} (ref >=60)
GLOBULIN: 2.3 g/dL (ref 1.9–3.7)
Glucose, Bld: 65 mg/dL (ref 65–139)
POTASSIUM: 3.9 mmol/L (ref 3.5–5.3)
SODIUM: 138 mmol/L (ref 135–146)
Total Bilirubin: 0.3 mg/dL (ref 0.2–1.2)
Total Protein: 6.6 g/dL (ref 6.1–8.1)

## 2018-06-07 LAB — CBC
HEMATOCRIT: 40.1 % (ref 35.0–45.0)
Hemoglobin: 13.6 g/dL (ref 11.7–15.5)
MCH: 29.2 pg (ref 27.0–33.0)
MCHC: 33.9 g/dL (ref 32.0–36.0)
MCV: 86.2 fL (ref 80.0–100.0)
MPV: 10.6 fL (ref 7.5–12.5)
PLATELETS: 304 10*3/uL (ref 140–400)
RBC: 4.65 10*6/uL (ref 3.80–5.10)
RDW: 11.9 % (ref 11.0–15.0)
WBC: 8.6 10*3/uL (ref 3.8–10.8)

## 2018-06-07 LAB — LIPID PANEL
CHOL/HDL RATIO: 2.6 (calc) (ref <5.0)
CHOLESTEROL: 131 mg/dL (ref <200)
HDL: 50 mg/dL — ABNORMAL LOW (ref >50)
LDL Cholesterol (Calc): 68 mg/dL (calc)
NON-HDL CHOLESTEROL (CALC): 81 mg/dL (ref <130)
Triglycerides: 47 mg/dL (ref <150)

## 2018-06-07 LAB — HEPATITIS C ANTIBODY
Hepatitis C Ab: NONREACTIVE
SIGNAL TO CUT-OFF: 0.01 (ref <1.00)

## 2018-06-07 LAB — HIV ANTIBODY (ROUTINE TESTING W REFLEX): HIV: NONREACTIVE

## 2018-06-07 LAB — TSH: TSH: 1.38 mIU/L

## 2018-06-10 ENCOUNTER — Encounter: Payer: Self-pay | Admitting: Family Medicine

## 2018-06-10 LAB — CYTOLOGY - PAP
CHLAMYDIA, DNA PROBE: NEGATIVE
DIAGNOSIS: NEGATIVE
HPV: NOT DETECTED
NEISSERIA GONORRHEA: NEGATIVE

## 2018-06-10 NOTE — Telephone Encounter (Signed)
I have patient on cancellation list for when we have an opening we will contact patient. As of right now no cancellations at this time

## 2018-06-13 ENCOUNTER — Ambulatory Visit (INDEPENDENT_AMBULATORY_CARE_PROVIDER_SITE_OTHER): Payer: 59 | Admitting: Obstetrics and Gynecology

## 2018-06-13 ENCOUNTER — Encounter: Payer: Self-pay | Admitting: Family Medicine

## 2018-06-13 ENCOUNTER — Encounter: Payer: Self-pay | Admitting: Obstetrics and Gynecology

## 2018-06-13 ENCOUNTER — Encounter

## 2018-06-13 VITALS — BP 128/89 | HR 86 | Ht 67.0 in | Wt 205.0 lb

## 2018-06-13 DIAGNOSIS — N939 Abnormal uterine and vaginal bleeding, unspecified: Secondary | ICD-10-CM | POA: Diagnosis not present

## 2018-06-13 NOTE — Progress Notes (Signed)
Gynecology Abnormal Uterine Bleeding Initial Evaluation   Chief Complaint:  Chief Complaint  Patient presents with  . Vaginal Bleeding    S/P hysterectomy referred by [facility]     History of Present Illness:    Paitient is a 32 y.o. [id number] who LMP was No LMP recorded. Patient has had a hysterectomy., presents today in consultation at the request of [name], NP at [facility].  She complains of irregular vaginal bleeding over the past year.  This may have once every few months.  Does not last long. Blood appears dark like old blood.  No clear inciting events such as intercourse.  Her hysterectomy in 1478 was complicated by postoperative pelvic hematoma which was laparoscopically evacuated with no active bleeding noted though. Both ovaries as well as a portion of the right tube remain.  In addition to the intermittent bleeding she has also noted increased vaginal discharge since her hysterectomy necessitating the use of liners.  Discharge is not malodorous, white to off white.  She has not had problems with recurrent UTI's.  She does have intermittent right pelvic pain.    Her mother was recently diagnosed with ovarian cancer.  She has not undergone staging surgery, chemotherapy or radiation at this point.    Review of Systems: Review of Systems  Constitutional: Negative.   Gastrointestinal: Positive for abdominal pain and blood in stool. Negative for constipation, diarrhea, heartburn, melena, nausea and vomiting.  Genitourinary: Negative.   Skin: Negative.     Past Medical History:  History reviewed. No pertinent past medical history.  Past Surgical History:  Past Surgical History:  Procedure Laterality Date  . ABDOMINAL HYSTERECTOMY    . COMBINED HYSTEROSCOPY DIAGNOSTIC / D&C     Several d/t multiple miscarriages  . TUBAL LIGATION      Obstetric History: [id number]  Family History:  Family History  Problem Relation Age of Onset  . COPD Mother    smoker  . Emphysema Father        smoker  . COPD Maternal Grandmother        smoker  . Pancreatic cancer Maternal Grandfather        runs in family  . Depression Paternal Grandfather        Shot himself; pt found him when she was 32yo    Social History:  Social History   Socioeconomic History  . Marital status: Married    Spouse name: Lennette Bihari  . Number of children: 2  . Years of education: Not on file  . Highest education level: Not on file  Occupational History  . Not on file  Social Needs  . Financial resource strain: Not hard at all  . Food insecurity:    Worry: Never true    Inability: Never true  . Transportation needs:    Medical: No    Non-medical: No  Tobacco Use  . Smoking status: Never Smoker  . Smokeless tobacco: Never Used  Substance and Sexual Activity  . Alcohol use: Never    Frequency: Never  . Drug use: Never  . Sexual activity: Yes    Partners: Male    Birth control/protection: Surgical  Lifestyle  . Physical activity:    Days per week: 3 days    Minutes per session: 30 min  . Stress: Not at all  Relationships  . Social connections:    Talks on phone: More than three times a week    Gets together: More than three times  a week    Attends religious service: Never    Active member of club or organization: No    Attends meetings of clubs or organizations: Never    Relationship status: Married  . Intimate partner violence:    Fear of current or ex partner: No    Emotionally abused: No    Physically abused: No    Forced sexual activity: No  Other Topics Concern  . Not on file  Social History Narrative   32yo and 32yo; married to her husband    Allergies:  Allergies  Allergen Reactions  . Penicillins Rash    Medications: Prior to Admission medications   Medication Sig Start Date End Date Taking? Authorizing Provider  Multiple Vitamin (MULTIVITAMIN) tablet Take 1 tablet by mouth daily.   Yes [provider]    Physical  Exam Blood pressure 128/89, pulse 86, height 5' 7"  (1.702 m), weight 205 lb (93 kg).  No LMP recorded. Patient has had a hysterectomy.  General: NAD HEENT: normocephalic, anicteric Pulmonary: No increased work of breathing Genitourinary:  External: Normal external female genitalia.  Normal urethral meatus, normal Bartholin's and Skene's glands.    Vagina: Normal vaginal mucosa, no evidence of prolapse.    Cervix: Surgically absent, intact and well healed vaginal cuff no evidence of granulation tissue, no evidence of bleeding  Uterus: Surgically absent  Adnexa: ovaries non-enlarged, no adnexal masses  Rectal: deferred  Lymphatic: no evidence of inguinal lymphadenopathy Extremities: no edema, erythema, or tenderness Neurologic: Grossly intact Psychiatric: mood appropriate, affect full  Female chaperone present for pelvic portions of the physical exam  Assessment: 32 y.o. [id number] with abnormal uterine bleeding  Plan: Problem List Items Addressed This Visit    None    Visit Diagnoses    Vaginal bleeding    -  Primary   Relevant Orders   US Transvaginal Non-OB      1) Discussed possible etiologies of vaginal bleeding in a patient status post prior TVH.  Granulation tissue is the most likely etiology but none appreciated on exam today.  There is also no evidence of prolapsing fallopian tube or other vaginal cuff abnormalities.  Since she has noted increased discharge since her hysterectomy a small vesicouterine fistula is also in the differential.   - Will proceed with TVUS - In order to rule out vesicouterine fistula if ultrasound is normal discussed utilizing Urogesic Blue or another methylene blue containing urogesic and having the patient wear a tampon.  Tampon to be removed prior to voiding.  If evidence of blue discoloration on tampon this would raise suspicion for a small vesicouterine fistula.   - negative vaginal cuff pap 06/06/2018.  Discussed per ASCCP guidelines pap smear  maybe discontinued at this point given that she is status post total hysterectomy with benign pathology  2) Family history of breast cancer in maternal aunt, ovarian cancer recently diagnosed in mother.  Told to verify with mom that ovarian cancer and whether she had BRCA testing.  If mom had not had testing or declines testing would offer testing to patient.  I offered referral to genetic counseling for discussion MyRisk testing because of her significant family history of breast and/or ovarian cancer.  If a mutation is found, the patient would be advised of an increased risk of breast cancer and ovarian cancer.  If a mutation is identified other family members could be tested and would have the opportunity to take advantage of approaches to prevention and early detection of breast  and ovarian cancer.      2) Return in about 1 week (around 06/20/2018) for TVUS and follow up.   Malachy Mood, MD, [facility], [facility] 06/13/2018, 3:40 PM

## 2018-06-13 NOTE — Patient Instructions (Signed)
Counseling BRCA I offered referral to genetic counseling for discussion MyRisk testing because of her significant family history of breast and/or ovarian cancer.  If a mutation is found, the patient would be advised of an increased risk of breast cancer and ovarian cancer.  If a mutation is identified other family members could be tested and would have the opportunity to take advantage of approaches to prevention and early detection of breast and ovarian cancer.     BRCA Gene Testing Why am I having this test? BRCA gene testing is done to check for the presence of harmful changes (mutations) in the BRCA1 gene or the BRCA2 gene (breast cancer susceptibility genes). If there is a mutation, the genes may not be able to help repair damaged cells in the body. As a result, the damaged cells may develop defects that can lead to certain types of cancer. You may have this test if you have a family history of certain types of cancer, including cancer of the:  Breast.  Ovaries.  Fallopian tubes.  Peritoneum.  Pancreas.  Prostate.  What kind of sample is taken? The test requires either a sample of blood or a sample of cells from your saliva. If a sample of blood is needed, it will probably be collected by inserting a needle into a vein. If a sample of saliva is needed, you will get instructions about how to collect the sample. What do the results mean? The test results can show whether you have a mutation in the BRCA1 or BRCA2 gene that increases your risk for certain cancers. Meaning of negative test results A negative test result means that you do not have a mutation in the BRCA1 or BRCA2 gene that is known to increase your risk for certain cancers. This does not mean that you will never get cancer. Talk with your health care provider or a genetic counselor about what this result means for you. Meaning of positive test results A positive test result means that you have a mutation in the BRCA1 or  BRCA2 gene that increases your risk for certain cancers. Women with a positive test result have an increased risk for breast and ovarian cancer. Both women and men with a mutation have an increased risk for breast cancer and may be at greater risk for other types of cancer. Getting a positive test result does not mean that you will develop cancer. Talk with your health care provider or a genetic counselor about what this result means for you. You may be told that you are a carrier. This means that you can pass the mutation to your children. Meaning of ambiguous test results Ambiguous, inconclusive, or uncertain test results mean that there is a change in the BRCA1 or BRCA2 gene, but it is a change that has not been linked to cancer. Talk with your health care provider or a genetic counselor about what this result means for you. Talk with your health care provider to discuss your results, treatment options, and if necessary, the need for more tests. Talk with your health care provider if you have any questions about your results. How do I get my results? It is up to you to get your test results. Ask your health care provider, or the department that is doing the test, when your results will be ready. This information is not intended to replace advice given to you by your health care provider. Make sure you discuss any questions you have with your health care provider.  Document Released: 08/10/2004 Document Revised: 03/20/2016 Document Reviewed: 03/08/2016 Elsevier Interactive Patient Education  [employer].

## 2018-06-14 ENCOUNTER — Encounter: Payer: Self-pay | Admitting: Obstetrics and Gynecology

## 2018-06-17 ENCOUNTER — Encounter: Payer: Self-pay | Admitting: Obstetrics and Gynecology

## 2018-06-19 ENCOUNTER — Ambulatory Visit (INDEPENDENT_AMBULATORY_CARE_PROVIDER_SITE_OTHER): Payer: 59

## 2018-06-19 ENCOUNTER — Encounter: Payer: Self-pay | Admitting: Obstetrics and Gynecology

## 2018-06-19 ENCOUNTER — Ambulatory Visit (INDEPENDENT_AMBULATORY_CARE_PROVIDER_SITE_OTHER): Payer: 59 | Admitting: Obstetrics and Gynecology

## 2018-06-19 VITALS — BP 122/80 | HR 95 | Ht 67.0 in | Wt 208.0 lb

## 2018-06-19 DIAGNOSIS — N83292 Other ovarian cyst, left side: Secondary | ICD-10-CM | POA: Diagnosis not present

## 2018-06-19 DIAGNOSIS — N939 Abnormal uterine and vaginal bleeding, unspecified: Secondary | ICD-10-CM | POA: Diagnosis not present

## 2018-06-19 MED ORDER — UROGESIC-BLUE 81.6 MG PO TABS: 1.0000 | ORAL_TABLET | Freq: Four times a day (QID) | ORAL | 0 refills | Status: AC

## 2018-06-19 NOTE — Progress Notes (Signed)
Gynecology Ultrasound Follow Up  Chief Complaint:  Chief Complaint  Patient presents with  . Follow-up     History of Present Illness: Patient is a 32 y.o. female who presents today for ultrasound evaluation of vaginal bleeding.  Ultrasound demonstrates the following findgins Adnexa: functional cysts left ovary and left hydrosalpinx Uterus: surgically absent Additional: no free fluid  Review of Systems: Review of Systems  Constitutional: Negative.   Gastrointestinal: Negative.   Genitourinary: Negative.     Past Medical History:  Past Medical History:  Diagnosis Date  . Family history of ovarian cancer    11/19 cancer genetic testing letter sent  . Family history of pancreatic cancer     Past Surgical History:  Past Surgical History:  Procedure Laterality Date  . ABDOMINAL HYSTERECTOMY    . COMBINED HYSTEROSCOPY DIAGNOSTIC / D&C     Several d/t multiple miscarriages  . TUBAL LIGATION      Gynecologic History:  No LMP recorded. Patient has had a hysterectomy. Contraception: N/Ah Last Pap:N/A  Family History:  Family History  Problem Relation Age of Onset  . COPD Mother        smoker  . Ovarian cancer Mother   . Emphysema Father        smoker  . COPD Maternal Grandmother        smoker  . Colon cancer Maternal Grandfather   . Depression Paternal Grandfather        Shot himself; pt found him when she was 32yo  . Breast cancer Maternal Aunt   . Ovarian cancer Maternal Aunt   . Pancreatic cancer Maternal Uncle     Social History:  Social History   Socioeconomic History  . Marital status: Married    Spouse name: Sharon Andrade  . Number of children: 2  . Years of education: Not on file  . Highest education level: Not on file  Occupational History  . Not on file  Social Needs  . Financial resource strain: Not hard at all  . Food insecurity:    Worry: Never true    Inability: Never true  . Transportation needs:    Medical: No    Non-medical: No    Tobacco Use  . Smoking status: Never Smoker  . Smokeless tobacco: Never Used  Substance and Sexual Activity  . Alcohol use: Never    Frequency: Never  . Drug use: Never  . Sexual activity: Yes    Partners: Male    Birth control/protection: Surgical  Lifestyle  . Physical activity:    Days per week: 3 days    Minutes per session: 30 min  . Stress: Not at all  Relationships  . Social connections:    Talks on phone: More than three times a week    Gets together: More than three times a week    Attends religious service: Never    Active member of club or organization: No    Attends meetings of clubs or organizations: Never    Relationship status: Married  . Intimate partner violence:    Fear of current or ex partner: No    Emotionally abused: No    Physically abused: No    Forced sexual activity: No  Other Topics Concern  . Not on file  Social History Narrative   32yo and 32yo; married to her husband    Allergies:  Allergies  Allergen Reactions  . Penicillins Rash    Medications: Prior to Admission medications   Medication  Sig Start Date End Date Taking? Authorizing Provider  Multiple Vitamin (MULTIVITAMIN) tablet Take 1 tablet by mouth daily.   Yes [provider]  Methen-Hyosc-Meth Blue-Na Phos (UROGESIC-BLUE) 81.6 MG TABS Take 1 tablet (81.6 mg total) by mouth 4 (four) times daily for 2 days. 06/19/18 06/21/18  Vena AustriaStaebler, Lashun Ramseyer, MD    Physical Exam Vitals: Blood pressure 122/80, pulse 95, height 5\' 7"  (1.702 m), weight 208 lb (94.3 kg).  General: NAD HEENT: normocephalic, anicteric Pulmonary: No increased work of breathing Extremities: no edema, erythema, or tenderness Neurologic: Grossly intact, normal gait Psychiatric: mood appropriate, affect full  [place] Transvaginal Non-ob  Result Date: 06/20/2018 Patient Name: Sharon SitesJennifer Andrade DOB: 09/05/1985 MRN: [id number] ULTRASOUND REPORT Location: [facility] Date of Service: 06/19/2018 Indications:  Bleeding s/p hysterectomy Findings: Hysterectomy Right Ovary measures 2.9 x 2.0 x 1.7 cm. It is normal in appearance. Left Ovary measures 3.9 x 2.3 x 1.8 cm with two complex cysts seen.     1. 1.6 x 1.5 x 1.3cm     2. 1.1 x 0.9 x 0.8cm Cystic tubular structure seen in the left adnexa near left ovary with no flow measuring 2.6 x 1.5cm. Questionable fluid filled fallopian tube vs other. There is no free fluid in the cul de sac. Impression: 1. Two complex cysts within the left ovary. Largest measuring 1.6cm. 2. Cystic tubular structure within the left adnexa near left ovary. Recommendations: 1.Clinical correlation with the patient's History and Physical Exam. Sharon Andrade, RDMS RVT Images reviewed.  Normal GYN study without visualized pathology.  Vena AustriaAndreas Filomeno Cromley, MD, Merlinda FrederickFACOG [facility], [facility] [facility] 06/20/2018, 9:18 AM    Assessment: 32 y.o. [id number] follow up for post hysterectomy vaginal bleeding  Plan: Problem List Items Addressed This Visit    None    Visit Diagnoses    Vaginal bleeding    -  Primary      1) .Ultrasound - normal ovaries with small functional appearing cysts and left hydrosalpinx consistent with patient history of unilateral salpingectomy.  No abnormal imaging at top of vaginal cuff.  2) R/O vesicovaginal fistula - rx urogesic blue, patient to place tampon, remove tampon prior to voiding.  If any evidence of blue on tip of tampon would raise concern for fistula  3) A total of 15 minutes were spent in face-to-face contact with the patient during this encounter with over half of that time devoted to counseling and coordination of care.  4) Return if symptoms worsen or fail to improve.    Vena AustriaAndreas Aulton Routt, MD, Evern CoreFACOG [facility], [facility] 06/20/2018, 12:19 PM

## 2018-06-22 ENCOUNTER — Emergency Department: Payer: 59

## 2018-06-22 ENCOUNTER — Encounter: Payer: Self-pay | Admitting: Emergency Medicine

## 2018-06-22 ENCOUNTER — Other Ambulatory Visit: Payer: Self-pay

## 2018-06-22 ENCOUNTER — Emergency Department
Admission: EM | Admit: 2018-06-22 | Discharge: 2018-06-23 | Disposition: A | Discharge status: Home / Self Care | Payer: 59 | Attending: Emergency Medicine | Admitting: Emergency Medicine

## 2018-06-22 DIAGNOSIS — R1032 Left lower quadrant pain: Secondary | ICD-10-CM

## 2018-06-22 DIAGNOSIS — N83202 Unspecified ovarian cyst, left side: Secondary | ICD-10-CM | POA: Diagnosis not present

## 2018-06-22 DIAGNOSIS — R1012 Left upper quadrant pain: Secondary | ICD-10-CM | POA: Diagnosis not present

## 2018-06-22 DIAGNOSIS — Z79899 Other long term (current) drug therapy: Secondary | ICD-10-CM | POA: Insufficient documentation

## 2018-06-22 DIAGNOSIS — N7011 Chronic salpingitis: Secondary | ICD-10-CM | POA: Insufficient documentation

## 2018-06-22 DIAGNOSIS — N838 Other noninflammatory disorders of ovary, fallopian tube and broad ligament: Secondary | ICD-10-CM | POA: Diagnosis not present

## 2018-06-22 LAB — BASIC METABOLIC PANEL
ANION GAP: 8 (ref 5–15)
BUN: 17 mg/dL (ref 6–20)
CO2: 25 mmol/L (ref 22–32)
CREATININE: 0.69 mg/dL (ref 0.44–1.00)
Calcium: 8.8 mg/dL — ABNORMAL LOW (ref 8.9–10.3)
Chloride: 108 mmol/L (ref 98–111)
GFR calc Af Amer: >60 mL/min (ref >60)
GFR calc non Af Amer: >60 mL/min (ref >60)
GLUCOSE: 113 mg/dL — AB (ref 70–99)
Potassium: 3.9 mmol/L (ref 3.5–5.1)
Sodium: 141 mmol/L (ref 135–145)

## 2018-06-22 LAB — URINALYSIS, COMPLETE (UACMP) WITH MICROSCOPIC
BACTERIA UA: NONE SEEN
Bilirubin Urine: NEGATIVE
GLUCOSE, UA: NEGATIVE mg/dL
Hgb urine dipstick: NEGATIVE
KETONES UR: NEGATIVE mg/dL
Leukocytes, UA: NEGATIVE
Nitrite: NEGATIVE
PROTEIN: NEGATIVE mg/dL
Specific Gravity, Urine: 1.016 (ref 1.005–1.030)
pH: 7 (ref 5.0–8.0)

## 2018-06-22 LAB — CBC WITH DIFFERENTIAL/PLATELET
Abs Immature Granulocytes: 0.08 10*3/uL — ABNORMAL HIGH (ref 0.00–0.07)
Basophils Absolute: 0.1 10*3/uL (ref 0.0–0.1)
Basophils Relative: 1 %
EOS ABS: 0.4 10*3/uL (ref 0.0–0.5)
Eosinophils Relative: 4 %
HCT: 40 % (ref 36.0–46.0)
Hemoglobin: 13.8 g/dL (ref 12.0–15.0)
IMMATURE GRANULOCYTES: 1 %
Lymphocytes Relative: 29 %
Lymphs Abs: 2.9 10*3/uL (ref 0.7–4.0)
MCH: 29.7 pg (ref 26.0–34.0)
MCHC: 34.5 g/dL (ref 30.0–36.0)
MCV: 86.2 fL (ref 80.0–100.0)
Monocytes Absolute: 0.8 10*3/uL (ref 0.1–1.0)
Monocytes Relative: 8 %
NEUTROS PCT: 57 %
NRBC: 0 % (ref 0.0–0.2)
Neutro Abs: 5.6 10*3/uL (ref 1.7–7.7)
PLATELETS: 312 10*3/uL (ref 150–400)
RBC: 4.64 MIL/uL (ref 3.87–5.11)
RDW: 12.6 % (ref 11.5–15.5)
WBC: 9.9 10*3/uL (ref 4.0–10.5)

## 2018-06-22 LAB — POCT PREGNANCY, URINE: PREG TEST UR: NEGATIVE

## 2018-06-22 MED ORDER — FENTANYL CITRATE (PF) 100 MCG/2ML IJ SOLN
50.0000 ug | Freq: Once | INTRAMUSCULAR | Status: AC
  Administered 2018-06-23: 50 ug via INTRAVENOUS
  Filled 2018-06-22: qty 2

## 2018-06-22 MED ORDER — ONDANSETRON HCL 4 MG/2ML IJ SOLN
4.0000 mg | Freq: Once | INTRAMUSCULAR | Status: AC
  Administered 2018-06-23: 4 mg via INTRAVENOUS
  Filled 2018-06-22: qty 2

## 2018-06-22 NOTE — ED Provider Notes (Signed)
[facility] Emergency Department Provider Note  ____________________________________________  Time seen: Approximately 11:14 PM  I have reviewed the triage vital signs and the nursing notes.   HISTORY  Chief Complaint Abdominal Pain   HPI Sharon Andrade is a 32 y.o. female with h/o abnormal uterine bleeding and ovarian cyst who presents for evaluation of left lower quadrant abdominal pain.  Patient reports the pain started earlier today, sharp, moderate to severe, constant, located in the left lower quadrant and nonradiating.  No fever or chills, no vaginal bleeding, no dysuria, no constipation or diarrhea.  Patient is currently being evaluated by [facility] for abnormal uterine bleeding and a heavy vaginal discharge which she has had since having a hysterectomy several years ago.  Patient had STD and HPV testing done 20 days ago.  She is being evaluated for possible vesicovaginal fistula.  As part of her OB/GYN evaluation 3 days ago she had a pelvic ultrasound which showed 2 left adnexal cysts and left hydrosalpinx.  She had no pain until this afternoon.   Past Medical History:  Diagnosis Date  . Family history of ovarian cancer    11/19 cancer genetic testing letter sent  . Family history of pancreatic cancer     Patient Active Problem List   Diagnosis Date Noted  . Abnormal vaginal bleeding 06/06/2018  . Trauma in childhood 06/06/2018    Past Surgical History:  Procedure Laterality Date  . ABDOMINAL HYSTERECTOMY    . COMBINED HYSTEROSCOPY DIAGNOSTIC / D&C     Several d/t multiple miscarriages  . TUBAL LIGATION      Prior to Admission medications   Medication Sig Start Date End Date Taking? Authorizing Provider  Multiple Vitamin (MULTIVITAMIN) tablet Take 1 tablet by mouth daily.   Yes [provider]    Allergies Penicillins  Family History  Problem Relation Age of Onset  . COPD Mother        smoker  .  Ovarian cancer Mother   . Emphysema Father        smoker  . COPD Maternal Grandmother        smoker  . Colon cancer Maternal Grandfather   . Depression Paternal Grandfather        Shot himself; pt found him when she was 32yo  . Breast cancer Maternal Aunt   . Ovarian cancer Maternal Aunt   . Pancreatic cancer Maternal Uncle     Social History Social History   Tobacco Use  . Smoking status: Never Smoker  . Smokeless tobacco: Never Used  Substance Use Topics  . Alcohol use: Never    Frequency: Never  . Drug use: Never    Review of Systems  Constitutional: Negative for fever. Eyes: Negative for visual changes. ENT: Negative for sore throat. Neck: No neck pain  Cardiovascular: Negative for chest pain. Respiratory: Negative for shortness of breath. Gastrointestinal: + LLQ abdominal pain. No vomiting or diarrhea. Genitourinary: Negative for dysuria. Musculoskeletal: Negative for back pain. Skin: Negative for rash. Neurological: Negative for headaches, weakness or numbness. Psych: No SI or HI  ____________________________________________   PHYSICAL EXAM:  VITAL SIGNS: ED Triage Vitals  Enc Vitals Group     BP 06/22/18 1935 (!) 148/85     Pulse Rate 06/22/18 1935 (!) 105     Resp 06/22/18 1935 16     Temp 06/22/18 1935 98.4 F (36.9 C)     Temp Source 06/22/18 1935 Oral     SpO2  06/22/18 1935 97 %     Weight 06/22/18 1936 208 lb (94.3 kg)     Height 06/22/18 1936 5\' 7"  (1.702 m)     Head Circumference --      Peak Flow --      Pain Score 06/22/18 1949 10     Pain Loc --      Pain Edu? --      Excl. in GC? --     Constitutional: Alert and oriented. Well appearing and in no apparent distress. HEENT:      Head: Normocephalic and atraumatic.         Eyes: Conjunctivae are normal. Sclera is non-icteric.       Mouth/Throat: Mucous membranes are moist.       Neck: Supple with no signs of meningismus. Cardiovascular: Regular rate and rhythm. No murmurs, gallops,  or rubs. 2+ symmetrical distal pulses are present in all extremities. No JVD. Respiratory: Normal respiratory effort. Lungs are clear to auscultation bilaterally. No wheezes, crackles, or rhonchi.  Gastrointestinal: Soft, LLQ tenderness to palpation, and non distended with positive bowel sounds. No rebound or guarding. Genitourinary: No CVA tenderness. Musculoskeletal: Nontender with normal range of motion in all extremities. No edema, cyanosis, or erythema of extremities. Neurologic: Normal speech and language. Face is symmetric. Moving all extremities. No gross focal neurologic deficits are appreciated. Skin: Skin is warm, dry and intact. No rash noted. Psychiatric: Mood and affect are normal. Speech and behavior are normal.  ____________________________________________   LABS (all labs ordered are listed, but only abnormal results are displayed)  Labs Reviewed  CBC WITH DIFFERENTIAL/PLATELET - Abnormal; Notable for the following components:      Result Value   Abs Immature Granulocytes 0.08 (*)    All other components within normal limits  BASIC METABOLIC PANEL - Abnormal; Notable for the following components:   Glucose, Bld 113 (*)    Calcium 8.8 (*)    All other components within normal limits  URINALYSIS, COMPLETE (UACMP) WITH MICROSCOPIC - Abnormal; Notable for the following components:   Color, Urine STRAW (*)    APPearance CLEAR (*)    All other components within normal limits  POCT PREGNANCY, URINE   ____________________________________________  EKG  none  ____________________________________________  RADIOLOGY  TVUS: PND   ____________________________________________   PROCEDURES  Procedure(s) performed: None Procedures Critical Care performed:  None ____________________________________________   INITIAL IMPRESSION / ASSESSMENT AND PLAN / ED COURSE   32 y.o. female with h/o abnormal uterine bleeding and ovarian cyst who presents for evaluation of left  lower quadrant abdominal pain. STD testing 20 days ago, patient is in monogamous relationship with husband, does not wish to have it repeated at this time which I think it is reasonable. Recent TVUS showing 2 L adnexal cysts, pain only started today therefore will repeat US to rule out torsion or ovarian rupture. Will get UA to rule out UTI. Discussed with Dr. Tiburcio Pea from ObGYN who recommended outpatient f/u with Dr. Bonney Aid as long as UA and labs are WNL, and [place] is unchanged. [place] pending. Care transferred to Dr. Lamont Snowball.       As part of my medical decision making, I reviewed the following data within the electronic MEDICAL RECORD NUMBER Nursing notes reviewed and incorporated, Labs reviewed , Old chart reviewed, Radiograph reviewed , A consult was requested and obtained from this/these consultant(s) ObGYN, Notes from prior ED visits and  Controlled Substance Database    Pertinent labs & imaging results that were  available during my care of the patient were reviewed by me and considered in my medical decision making (see chart for details).    ____________________________________________   FINAL CLINICAL IMPRESSION(S) / ED DIAGNOSES  Final diagnoses:  LLQ abdominal pain  Left ovarian cyst  Hydrosalpinx      NEW MEDICATIONS STARTED DURING THIS VISIT:  ED Discharge Orders    None       Note:  This document was prepared using Dragon voice recognition software and may include unintentional dictation errors.    Nita Sickle, MD 06/22/18 [id number]

## 2018-06-22 NOTE — Discharge Instructions (Signed)

## 2018-06-22 NOTE — ED Triage Notes (Addendum)
Pt reports left lower quadrant pain for 3 days; seen by OB/GYN Thursday for vaginal bleeding; was told she had 2 ovarian cysts and fluid in the fallopian tube; pt says pain is worse today; has not taken any medication for pain; also having vaginal discharge "for a long time"; denies N/V;

## 2018-06-23 DIAGNOSIS — N83202 Unspecified ovarian cyst, left side: Secondary | ICD-10-CM | POA: Diagnosis not present

## 2018-06-23 DIAGNOSIS — N7011 Chronic salpingitis: Secondary | ICD-10-CM | POA: Diagnosis not present

## 2018-06-23 DIAGNOSIS — Z79899 Other long term (current) drug therapy: Secondary | ICD-10-CM | POA: Diagnosis not present

## 2018-06-23 NOTE — ED Provider Notes (Signed)
US unchanged.  Stable for dc.   Merrily Brittleifenbark, Dafne Nield, MD 06/23/18 0003

## 2018-06-25 ENCOUNTER — Other Ambulatory Visit: Payer: Self-pay | Admitting: Obstetrics and Gynecology

## 2018-06-25 ENCOUNTER — Ambulatory Visit (INDEPENDENT_AMBULATORY_CARE_PROVIDER_SITE_OTHER): Payer: 59 | Admitting: Obstetrics and Gynecology

## 2018-06-25 ENCOUNTER — Encounter: Payer: Self-pay | Admitting: Obstetrics and Gynecology

## 2018-06-25 VITALS — BP 130/84 | Ht 67.0 in | Wt 208.0 lb

## 2018-06-25 DIAGNOSIS — N820 Vesicovaginal fistula: Secondary | ICD-10-CM

## 2018-06-25 DIAGNOSIS — K5901 Slow transit constipation: Secondary | ICD-10-CM | POA: Diagnosis not present

## 2018-06-25 MED ORDER — POLYETHYLENE GLYCOL 3350 17 G PO PACK: 17.0000 g | PACK | Freq: Every day | ORAL | 6 refills | Status: DC | PRN

## 2018-06-25 NOTE — Progress Notes (Signed)
Patient ID: Sharon SitesJennifer Wareing, female   DOB: 11/11/1985, 32 y.o.   MRN: [id number]  Reason for Consult: Follow-up (ER follow up Left ovarian cyst, 5/10 on pain scale today. )   Referred by Doren CustardBoyce, Emily E, FNP  Subjective:     HPI:  Sharon Andrade is a 32 y.o. female she is following up today from the ER.  She was seen there on Saturday for abdominal pain.  She had a pelvic ultrasound which was largely unchanged from her prior ultrasound earlier this week.  She has been following with Dr. Bonney AidStaebler for a fistula.  She recently wore a tampon which turned blue suggesting a vesico-vaginal fistula.  She is planning on following with Dr. Bonney AidStaebler for this issue.  She will likely need a referral to urogynecology.  We discussed the patient's bowel movements.  She stated that since her hysterectomy she has been struggling with constipation.  She also reported that she generally has daily bowel movements.  She reported that her last bowel movement was yesterday but it was hard like pebbles.  She admits that that she does not have a good diet with a lot of fiber and often eats junk food.  She is frustrated and feels desperate.  She regrets having her hysterectomy 3 years ago.  She feels like she should sue because she was sent home too soon after her surgery and had a complication of a hematoma.  The hematoma was drained laparoscopically.  She reports that she has discomfort with intercourse.  The discomfort used to be on the right side and now is on the left.  She also reports copious white discharge sometimes brown sometimes black.  She reports having to wear a pad daily with frustrated to her because she does not know other women that have to do this.  She wants to know if she should have her ovaries removed.   Past Medical History:  Diagnosis Date  . Family history of ovarian cancer    11/19 cancer genetic testing letter sent  . Family history of pancreatic cancer    Family History  Problem Relation Age  of Onset  . COPD Mother        smoker  . Ovarian cancer Mother   . Emphysema Father        smoker  . COPD Maternal Grandmother        smoker  . Colon cancer Maternal Grandfather   . Depression Paternal Grandfather        Shot himself; pt found him when she was 32yo  . Breast cancer Maternal Aunt   . Ovarian cancer Maternal Aunt   . Pancreatic cancer Maternal Uncle    Past Surgical History:  Procedure Laterality Date  . ABDOMINAL HYSTERECTOMY    . COMBINED HYSTEROSCOPY DIAGNOSTIC / D&C     Several d/t multiple miscarriages  . TUBAL LIGATION      Short Social History:  Social History   Tobacco Use  . Smoking status: Never Smoker  . Smokeless tobacco: Never Used  Substance Use Topics  . Alcohol use: Never    Frequency: Never    Allergies  Allergen Reactions  . Penicillins Rash    Current Outpatient Medications  Medication Sig Dispense Refill  . Multiple Vitamin (MULTIVITAMIN) tablet Take 1 tablet by mouth daily.    . polyethylene glycol (MIRALAX) packet Take 17 g by mouth daily as needed. 14 each 6   No current facility-administered medications for this visit.  Review of Systems  Constitutional: Negative for chills, fatigue, fever and unexpected weight change.  HENT: Negative for trouble swallowing.  Eyes: Negative for loss of vision.  Respiratory: Negative for cough, shortness of breath and wheezing.  Cardiovascular: Negative for chest pain, leg swelling, palpitations and syncope.  GI: Positive for abdominal pain. Negative for blood in stool, diarrhea, nausea and vomiting.  GU: Negative for difficulty urinating, dysuria, frequency and hematuria.  Musculoskeletal: Negative for back pain, leg pain and joint pain.  Skin: Negative for rash.  Neurological: Negative for dizziness, headaches, light-headedness, numbness and seizures.  Psychiatric: Negative for behavioral problem, confusion, depressed mood and sleep disturbance.        Objective:  Objective    Vitals:   06/25/18 0904  BP: 130/84  Weight: 208 lb (94.3 kg)  Height: 5\' 7"  (1.702 m)   Body mass index is 32.58 kg/m.  Physical Exam  Constitutional: She is oriented to person, place, and time. She appears well-developed and well-nourished.  HENT:  Head: Normocephalic and atraumatic.  Eyes: Pupils are equal, round, and reactive to light. EOM are normal.  Cardiovascular: Normal rate and regular rhythm.  Pulmonary/Chest: Effort normal. No respiratory distress.  Neurological: She is alert and oriented to person, place, and time.  Skin: Skin is warm and dry.  Psychiatric: She has a normal mood and affect. Her behavior is normal. Judgment and thought content normal.  Nursing note and vitals reviewed.        Assessment/Plan:     We will have patient return to follow-up with Dr. Bonney Aid regarding her issues of a vesicovaginal fistula since he is familiar with her and they are actively working of this problem.  I reviewed with her the results of her ER ultrasound.  We discussed hydration, a diet rich in fiber, and the importance of daily bowel movements.  I sent a prescription for MiraLAX to the patient's pharmacy for her to take as needed.  We discussed supportive measures for pain control until that visit including taking 600 mg of ibuprofen every 6 hours, warm showers and heating pad on the abdomen.   More than 25 minutes were spent face to face with the patient in the room with more than 50% of the time spent providing counseling and discussing the plan of management.      Adelene Idler MD [facility], [facility] 06/25/18 9:47 AM

## 2018-07-03 ENCOUNTER — Ambulatory Visit (INDEPENDENT_AMBULATORY_CARE_PROVIDER_SITE_OTHER): Payer: 59 | Admitting: Obstetrics and Gynecology

## 2018-07-03 ENCOUNTER — Encounter: Payer: Self-pay | Admitting: Obstetrics and Gynecology

## 2018-07-03 VITALS — BP 143/89 | HR 98 | Wt 212.0 lb

## 2018-07-03 DIAGNOSIS — N939 Abnormal uterine and vaginal bleeding, unspecified: Secondary | ICD-10-CM | POA: Diagnosis not present

## 2018-07-03 DIAGNOSIS — N7011 Chronic salpingitis: Secondary | ICD-10-CM | POA: Diagnosis not present

## 2018-07-03 DIAGNOSIS — N820 Vesicovaginal fistula: Secondary | ICD-10-CM | POA: Diagnosis not present

## 2018-07-06 ENCOUNTER — Encounter: Payer: Self-pay | Admitting: Family Medicine

## 2018-07-10 NOTE — Progress Notes (Signed)
Obstetrics & Gynecology Office Visit   Chief Complaint:  Chief Complaint  Patient presents with  . Follow-up    referred by Schuman    History of Present Illness: Patient return for follow up of unexplained vaginal [facility].  She had also reported increased discharge since her TVH.  The patient took urogesic blue and wore a tampon.  Prior to voiding she removed the tampon and had noted blue on the tampon.  Based on this referral has been sent to urogynecology for evaluation of possible small vesicovaginal fistula.  On prior exam no fistula was appreciated visually.  Ultrasound imaging also revealed left hydrosalpinx.  Prior to her hysterectomy the patient had undergone partial salpingectomy in 2013.  [facility] normal distal left fallopian tube.     Review of Systems: Review of Systems  Constitutional: Negative.   Gastrointestinal: Positive for abdominal pain. Negative for blood in stool, constipation, diarrhea, heartburn, melena, nausea and vomiting.  Genitourinary: Negative.      Past Medical History:  Past Medical History:  Diagnosis Date  . Family history of ovarian cancer    11/19 cancer genetic testing letter sent  . Family history of pancreatic cancer     Past Surgical History:  Past Surgical History:  Procedure Laterality Date  . ABDOMINAL HYSTERECTOMY    . COMBINED HYSTEROSCOPY DIAGNOSTIC / D&C     Several d/t multiple miscarriages  . TUBAL LIGATION      Gynecologic History: No LMP recorded. Patient has had a hysterectomy.  Obstetric History: [id number]  Family History:  Family History  Problem Relation Age of Onset  . COPD Mother        smoker  . Ovarian cancer Mother   . Emphysema Father        smoker  . COPD Maternal Grandmother        smoker  . Colon cancer Maternal Grandfather   . Depression Paternal Grandfather        Shot himself; pt found him when she was 32yo  . Breast cancer Maternal Aunt   .  Ovarian cancer Maternal Aunt   . Pancreatic cancer Maternal Uncle     Social History:  Social History   Socioeconomic History  . Marital status: Married    Spouse name: Caryn BeeKevin  . Number of children: 2  . Years of education: Not on file  . Highest education level: Not on file  Occupational History  . Not on file  Social Needs  . Financial resource strain: Not hard at all  . Food insecurity:    Worry: Never true    Inability: Never true  . Transportation needs:    Medical: No    Non-medical: No  Tobacco Use  . Smoking status: Never Smoker  . Smokeless tobacco: Never Used  Substance and Sexual Activity  . Alcohol use: Never    Frequency: Never  . Drug use: Never  . Sexual activity: Yes    Partners: Male    Birth control/protection: Surgical  Lifestyle  . Physical activity:    Days per week: 3 days    Minutes per session: 30 min  . Stress: Not at all  Relationships  . Social connections:    Talks on phone: More than three times a week    Gets together: More than three times a week    Attends religious service: Never    Active member of club or organization: No  Attends meetings of clubs or organizations: Never    Relationship status: Married  . Intimate partner violence:    Fear of current or ex partner: No    Emotionally abused: No    Physically abused: No    Forced sexual activity: No  Other Topics Concern  . Not on file  Social History Narrative   32yo and 32yo; married to her husband    Allergies:  Allergies  Allergen Reactions  . Penicillins Rash    Medications: Prior to Admission medications   Medication Sig Start Date End Date Taking? Authorizing Provider  Multiple Vitamin (MULTIVITAMIN) tablet Take 1 tablet by mouth daily.    [provider]  polyethylene glycol (MIRALAX) packet Take 17 g by mouth daily as needed. 06/25/18   Natale Milch, MD    Physical Exam Vitals:  Vitals:   07/03/18 1557  BP: (!) 143/89  Pulse: 98    No LMP recorded. Patient has had a hysterectomy.  General: NAD HEENT: normocephalic, anicteric Pulmonary: No increased work of breathing Neurologic: Grossly intact Psychiatric: mood appropriate, affect full  Female chaperone present for pelvic  portions of the physical exam  Assessment: 32 y.o. [id number] with concern for possible vaginal fistula as well as left sided pelvic pain, possible hydrosalpinx vs hemorrhagic ovarian cyst  Plan: Problem List Items Addressed This Visit      Other   Abnormal vaginal bleeding    Other Visit Diagnoses    Hydrosalpinx    -  Primary   Vesicovaginal fistula         1) Hydrosalpinx - based on tubular appearance this is favored over hemorrhagic ovarian cyst.  Suspect a tubal remnant.  2) Vaginal fistula - will refer to Urogynecology for further evaluation and work up   3) A total of 15 minutes were spent in face-to-face contact with the patient during this encounter with over half of that time devoted to counseling and coordination of care.   Vena Austria, MD, [facility], [facility]

## 2018-07-17 ENCOUNTER — Ambulatory Visit: Payer: 59 | Admitting: Family Medicine

## 2018-07-19 ENCOUNTER — Ambulatory Visit: Payer: 59 | Admitting: Family Medicine

## 2018-07-25 ENCOUNTER — Encounter: Payer: Self-pay | Admitting: Family Medicine

## 2018-07-25 NOTE — Telephone Encounter (Signed)
Please call patient to reschedule her appointment if possible per her request. Thanks!

## 2018-08-02 ENCOUNTER — Ambulatory Visit: Payer: 59 | Admitting: Family Medicine

## 2018-08-02 ENCOUNTER — Encounter: Payer: Self-pay | Admitting: Family Medicine

## 2018-08-02 VITALS — BP 126/78 | HR 91 | Temp 98.0°F | Resp 16 | Ht 67.0 in | Wt 213.5 lb

## 2018-08-02 DIAGNOSIS — Z6831 Body mass index (BMI) 31.0-31.9, adult: Secondary | ICD-10-CM | POA: Diagnosis not present

## 2018-08-02 DIAGNOSIS — F331 Major depressive disorder, recurrent, moderate: Secondary | ICD-10-CM | POA: Diagnosis not present

## 2018-08-02 DIAGNOSIS — T1490XA Injury, unspecified, initial encounter: Secondary | ICD-10-CM

## 2018-08-02 DIAGNOSIS — E6609 Other obesity due to excess calories: Secondary | ICD-10-CM

## 2018-08-02 MED ORDER — BUPROPION HCL ER (XL) 150 MG PO TB24: 150.0000 mg | ORAL_TABLET | Freq: Every day | ORAL | 1 refills | Status: DC

## 2018-08-02 MED ORDER — BUSPIRONE HCL 7.5 MG PO TABS: ORAL_TABLET | ORAL | 1 refills | Status: DC

## 2018-08-02 MED ORDER — LORCASERIN HCL 10 MG PO TABS: 10.0000 mg | ORAL_TABLET | Freq: Two times a day (BID) | ORAL | 2 refills | Status: DC
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
## 2018-08-02 NOTE — Patient Instructions (Addendum)
Here are some resources to help you if you feel you are in a mental health crisis:  National Suicide Prevention Lifeline - Call (712) 829-45141-([phone]  for help - Website with more resources: ARanked.fihttps://suicidepreventionlifeline.org/  [facility] - Call [phone] for help. - Mobile Crisis Program available 24 hours a day, 365 days a year. - Available for anyone of any age in [place].  [facility] - Address: [address], [place] - Telephone: [phone]  - Hours of Operation: Sunday - Saturday - 8:00 a.m. - 8:00 p.m. - Medicaid, Medicare (Government Issued Only), BCBS, and [employer] - Crisis Management, Outpatient Individual & Group Therapy, Psychiatrists on-site to provide medication management, In-Home Psychiatric Care, and Peer Support Care.  [facility] - Call [phone] for help. - Mobile Crisis Program available 24 hours a day, 365 days a year. - Available for anyone of any age in [place]  12 Ways to Curb Anxiety  ?Anxiety is normal human sensation. It is what helped our ancestors survive the pitfalls of the wilderness. Anxiety is defined as experiencing worry or nervousness about an imminent event or something with an uncertain outcome. It is a feeling experienced by most people at some point in their lives. Anxiety can be triggered by a very personal issue, such as the illness of a loved one, or an event of global proportions, such as a refugee crisis. Some of the symptoms of anxiety are:  Feeling restless.  Having a feeling of impending danger.  Increased heart rate.  Rapid breathing. Sweating.  Shaking.  Weakness or feeling tired.  Difficulty concentrating on anything except the current worry.  Insomnia.  Stomach or bowel problems. What can we do about anxiety we may be feeling? There are many techniques to help manage stress and relax. Here are 12  ways you can reduce your anxiety almost immediately: 1. Turn off the constant feed of information. Take a social media sabbatical. Studies have shown that social media directly contributes to social anxiety.  2. Monitor your television viewing habits. Are you watching shows that are also contributing to your anxiety, such as 24-hour news stations? Try watching something else, or better yet, nothing at all. Instead, listen to music, read an inspirational book or practice a hobby. 3. Eat nutritious meals. Also, don't skip meals and keep healthful snacks on hand. Hunger and poor diet contributes to feeling anxious. 4. Sleep. Sleeping on a regular schedule for at least seven to eight hours a night will do wonders for your outlook when you are awake. 5. Exercise. Regular exercise will help rid your body of that anxious energy and help you get more restful sleep. 6. Try deep (diaphragmatic) breathing. Inhale slowly through your nose for five seconds and exhale through your mouth. 7. Practice acceptance and gratitude. When anxiety hits, accept that there are things out of your control that shouldn't be of immediate concern.  8. Seek out humor. When anxiety strikes, watch a funny video, read jokes or call a friend who makes you laugh. Laughter is healing for our bodies and releases endorphins that are calming. 9. Stay positive. Take the effort to replace negative thoughts with positive ones. Try to see a stressful situation in a positive light. Try to come up with solutions rather than dwelling on the problem. 10. Figure out what triggers your anxiety. Keep a journal and make note of anxious moments and the events surrounding them. This will help  you identify triggers you can avoid or even eliminate. 11. Talk to someone. Let a trusted friend, family member or even trained professional know that you are feeling overwhelmed and anxious. Verbalize what you are feeling and why.  12. Volunteer. If your anxiety is  triggered by a crisis on a large scale, become an advocate and work to resolve the problem that is causing you unease. Anxiety is often unwelcome and can become overwhelming. If not kept in check, it can become a disorder that could require medical treatment. However, if you take the time to care for yourself and avoid the triggers that make you anxious, you will be able to find moments of relaxation and clarity that make your life much more enjoyable.       High-Fiber Diet Fiber, also called dietary fiber, is a type of carbohydrate that is found in fruits, vegetables, whole grains, and beans. A high-fiber diet can have many health benefits. Your health care provider may recommend a high-fiber diet to help:  Prevent constipation. Fiber can make your bowel movements more regular.  Lower your cholesterol.  Relieve the following conditions: ? Swelling of veins in the anus (hemorrhoids). ? Swelling and irritation (inflammation) of specific areas of the digestive tract (uncomplicated diverticulosis). ? A problem of the large intestine (colon) that sometimes causes pain and diarrhea (irritable bowel syndrome, IBS).  Prevent overeating as part of a weight-loss plan.  Prevent heart disease, type 2 diabetes, and certain cancers. What is my plan? The recommended daily fiber intake in grams (g) includes:  38 g for men age 64 or younger.  30 g for men over age 58.  25 g for women age 45 or younger.  21 g for women over age 35. You can get the recommended daily intake of dietary fiber by:  Eating a variety of fruits, vegetables, grains, and beans.  Taking a fiber supplement, if it is not possible to get enough fiber through your diet. What do I need to know about a high-fiber diet?  It is better to get fiber through food sources rather than from fiber supplements. There is not a lot of research about how effective supplements are.  Always check the fiber content on the nutrition facts  label of any prepackaged food. Look for foods that contain 5 g of fiber or more per serving.  Talk with a diet and nutrition specialist (dietitian) if you have questions about specific foods that are recommended or not recommended for your medical condition, especially if those foods are not listed below.  Gradually increase how much fiber you consume. If you increase your intake of dietary fiber too quickly, you may have bloating, cramping, or gas.  Drink plenty of water. Water helps you to digest fiber. What are tips for following this plan?  Eat a wide variety of high-fiber foods.  Make sure that half of the grains that you eat each day are whole grains.  Eat breads and cereals that are made with whole-grain flour instead of refined flour or white flour.  Eat brown rice, bulgur wheat, or millet instead of white rice.  Start the day with a breakfast that is high in fiber, such as a cereal that contains 5 g of fiber or more per serving.  Use beans in place of meat in soups, salads, and pasta dishes.  Eat high-fiber snacks, such as berries, raw vegetables, nuts, and popcorn.  Choose whole fruits and vegetables instead of processed forms like juice or sauce.  What foods can I eat?  Fruits Berries. Pears. Apples. Oranges. Avocado. Prunes and raisins. Dried figs. Vegetables Sweet potatoes. Spinach. Kale. Artichokes. Cabbage. Broccoli. Cauliflower. Green peas. Carrots. Squash. Grains Whole-grain breads. Multigrain cereal. Oats and oatmeal. Brown rice. Barley. Bulgur wheat. Millet. Quinoa. Bran muffins. Popcorn. Rye wafer crackers. Meats and other proteins Navy, kidney, and pinto beans. Soybeans. Split peas. Lentils. Nuts and seeds. Dairy Fiber-fortified yogurt. Beverages Fiber-fortified soy milk. Fiber-fortified orange juice. Other foods Fiber bars. The items listed above may not be a complete list of recommended foods and beverages. Contact a dietitian for more options. What  foods are not recommended? Fruits Fruit juice. Cooked, strained fruit. Vegetables Fried potatoes. Canned vegetables. Well-cooked vegetables. Grains White bread. Pasta made with refined flour. White rice. Meats and other proteins Fatty cuts of meat. Fried chicken or fried fish. Dairy Milk. Yogurt. Cream cheese. Sour cream. Fats and oils Butters. Beverages Soft drinks. Other foods Cakes and pastries. The items listed above may not be a complete list of foods and beverages to avoid. Contact a dietitian for more information. Summary  Fiber is a type of carbohydrate. It is found in fruits, vegetables, whole grains, and beans.  There are many health benefits of eating a high-fiber diet, such as preventing constipation, lowering blood cholesterol, helping with weight loss, and reducing your risk of heart disease, diabetes, and certain cancers.  Gradually increase your intake of fiber. Increasing too fast can result in cramping, bloating, and gas. Drink plenty of water while you increase your fiber.  The best sources of fiber include whole fruits and vegetables, whole grains, nuts, seeds, and beans. This information is not intended to replace advice given to you by your health care provider. Make sure you discuss any questions you have with your health care provider. Document Released: 07/17/2005 Document Revised: 05/21/2017 Document Reviewed: 05/21/2017 Elsevier Interactive Patient Education  2019 [employer]

## 2018-08-09 ENCOUNTER — Ambulatory Visit: Payer: 59 | Admitting: Family Medicine

## 2018-08-09 ENCOUNTER — Encounter: Payer: Self-pay | Admitting: Family Medicine

## 2018-08-12 NOTE — Telephone Encounter (Signed)
Sharon Andrade - please check on PA.

## 2018-09-03 ENCOUNTER — Ambulatory Visit: Payer: 59 | Admitting: Family Medicine

## 2018-09-10 ENCOUNTER — Encounter: Payer: Self-pay | Admitting: Family Medicine

## 2018-09-10 ENCOUNTER — Ambulatory Visit: Payer: 59 | Admitting: Family Medicine

## 2018-09-14 ENCOUNTER — Encounter: Payer: Self-pay | Admitting: Family Medicine

## 2018-09-14 DIAGNOSIS — E6609 Other obesity due to excess calories: Secondary | ICD-10-CM

## 2018-09-14 DIAGNOSIS — Z6831 Body mass index (BMI) 31.0-31.9, adult: Principal | ICD-10-CM

## 2018-09-16 MED ORDER — LIRAGLUTIDE -WEIGHT MANAGEMENT 18 MG/3ML ~~LOC~~ SOPN: PEN_INJECTOR | SUBCUTANEOUS | 1 refills | Status: AC

## 2018-09-23 ENCOUNTER — Encounter: Payer: Self-pay | Admitting: Family Medicine

## 2018-09-24 ENCOUNTER — Ambulatory Visit: Payer: 59 | Admitting: Family Medicine

## 2018-10-02 ENCOUNTER — Encounter: Payer: Self-pay | Admitting: Family Medicine

## 2018-10-02 ENCOUNTER — Other Ambulatory Visit: Payer: Self-pay | Admitting: Family Medicine

## 2018-10-02 DIAGNOSIS — T1490XA Injury, unspecified, initial encounter: Secondary | ICD-10-CM

## 2018-10-02 DIAGNOSIS — Z6831 Body mass index (BMI) 31.0-31.9, adult: Principal | ICD-10-CM

## 2018-10-02 DIAGNOSIS — F331 Major depressive disorder, recurrent, moderate: Secondary | ICD-10-CM

## 2018-10-02 DIAGNOSIS — E6609 Other obesity due to excess calories: Secondary | ICD-10-CM

## 2018-10-03 ENCOUNTER — Encounter: Payer: Self-pay | Admitting: Family Medicine

## 2018-10-03 ENCOUNTER — Ambulatory Visit: Payer: 59 | Admitting: Family Medicine

## 2018-10-03 ENCOUNTER — Other Ambulatory Visit
Admission: RE | Admit: 2018-10-03 | Discharge: 2018-10-03 | Disposition: A | Discharge status: Home / Self Care | Payer: 59 | Source: Ambulatory Visit | Attending: Family Medicine | Admitting: Family Medicine

## 2018-10-03 VITALS — BP 128/90 | HR 105 | Temp 98.0°F | Resp 16 | Ht 67.0 in | Wt 207.3 lb

## 2018-10-03 DIAGNOSIS — R112 Nausea with vomiting, unspecified: Secondary | ICD-10-CM

## 2018-10-03 DIAGNOSIS — R197 Diarrhea, unspecified: Secondary | ICD-10-CM | POA: Insufficient documentation

## 2018-10-03 DIAGNOSIS — R1031 Right lower quadrant pain: Secondary | ICD-10-CM

## 2018-10-03 LAB — CBC WITH DIFFERENTIAL/PLATELET
Abs Immature Granulocytes: 0.03 10*3/uL (ref 0.00–0.07)
BASOS ABS: 0.1 10*3/uL (ref 0.0–0.1)
Basophils Relative: 1 %
EOS ABS: 0.2 10*3/uL (ref 0.0–0.5)
EOS PCT: 3 %
HEMATOCRIT: 46.4 % — AB (ref 36.0–46.0)
Hemoglobin: 15.8 g/dL — ABNORMAL HIGH (ref 12.0–15.0)
Immature Granulocytes: 0 %
LYMPHS ABS: 2 10*3/uL (ref 0.7–4.0)
Lymphocytes Relative: 26 %
MCH: 29.3 pg (ref 26.0–34.0)
MCHC: 34.1 g/dL (ref 30.0–36.0)
MCV: 85.9 fL (ref 80.0–100.0)
MONO ABS: 0.6 10*3/uL (ref 0.1–1.0)
Monocytes Relative: 8 %
NRBC: 0 % (ref 0.0–0.2)
Neutro Abs: 4.8 10*3/uL (ref 1.7–7.7)
Neutrophils Relative %: 62 %
Platelets: 340 10*3/uL (ref 150–400)
RBC: 5.4 MIL/uL — AB (ref 3.87–5.11)
RDW: 12.2 % (ref 11.5–15.5)
WBC: 7.6 10*3/uL (ref 4.0–10.5)

## 2018-10-03 LAB — COMPREHENSIVE METABOLIC PANEL
ALT: 22 U/L (ref 0–44)
AST: 19 U/L (ref 15–41)
Albumin: 4.5 g/dL (ref 3.5–5.0)
Alkaline Phosphatase: 67 U/L (ref 38–126)
Anion gap: 8 (ref 5–15)
BILIRUBIN TOTAL: 0.6 mg/dL (ref 0.3–1.2)
BUN: 15 mg/dL (ref 6–20)
CALCIUM: 9.2 mg/dL (ref 8.9–10.3)
CO2: 25 mmol/L (ref 22–32)
CREATININE: 0.74 mg/dL (ref 0.44–1.00)
Chloride: 104 mmol/L (ref 98–111)
Glucose, Bld: 87 mg/dL (ref 70–99)
Potassium: 4.3 mmol/L (ref 3.5–5.1)
Sodium: 137 mmol/L (ref 135–145)
TOTAL PROTEIN: 8.1 g/dL (ref 6.5–8.1)

## 2018-10-03 MED ORDER — ONDANSETRON HCL 4 MG PO TABS: 4.0000 mg | ORAL_TABLET | Freq: Three times a day (TID) | ORAL | 0 refills | Status: AC | PRN

## 2018-10-03 NOTE — Patient Instructions (Signed)
Nausea and Vomiting, Adult  Nausea is feeling sick to your stomach or feeling that you are about to throw up (vomit). Vomiting is when food in your stomach is thrown up and out of the mouth. Throwing up can make you feel weak. It can also make you lose too much water in your body (get dehydrated). If you lose too much water in your body, you may:  · Feel tired.  · Feel thirsty.  · Have a dry mouth.  · Have cracked lips.  · Go pee (urinate) less often.  Older adults and people with other diseases or a weak body defense system (immune system) are at higher risk for losing too much water in the body. If you feel sick to your stomach and you throw up, it is important to follow instructions from your doctor about how to take care of yourself.  Follow these instructions at home:  Watch your symptoms for any changes. Tell your doctor about them. Follow these instructions to care for yourself at home.  Eating and drinking         · Take an ORS (oral rehydration solution). This is a drink that is sold at pharmacies and stores.  · Drink clear fluids in small amounts as you are able, such as:  ? Water.  ? Ice chips.  ? Fruit juice that has water added (diluted fruit juice).  ? Low-calorie sports drinks.  · Eat bland, easy-to-digest foods in small amounts as you are able, such as:  ? Bananas.  ? Applesauce.  ? Rice.  ? Low-fat (lean) meats.  ? Toast.  ? Crackers.  · Avoid drinking fluids that have a lot of sugar or caffeine in them. This includes energy drinks, sports drinks, and soda.  · Avoid alcohol.  · Avoid spicy or fatty foods.  General instructions  · Take over-the-counter and prescription medicines only as told by your doctor.  · Drink enough fluid to keep your pee (urine) pale yellow.  · Wash your hands often with soap and water. If you cannot use soap and water, use hand sanitizer.  · Make sure that all people in your home wash their hands well and often.  · Rest at home while you get better.  · Watch your condition  for any changes.  · Take slow and deep breaths when you feel sick to your stomach.  · Keep all follow-up visits as told by your doctor. This is important.  Contact a doctor if:  · Your symptoms get worse.  · You have new symptoms.  · You have a fever.  · You cannot drink fluids without throwing up.  · You feel sick to your stomach for more than 2 days.  · You feel light-headed or dizzy.  · You have a headache.  · You have muscle cramps.  · You have a rash.  · You have pain while peeing.  Get help right away if:  · You have pain in your chest, neck, arm, or jaw.  · You feel very weak or you pass out (faint).  · You throw up again and again.  · You have throw up that is bright red or looks like black coffee grounds.  · You have bloody or black poop (stools) or poop that looks like tar.  · You have a very bad headache, a stiff neck, or both.  · You have very bad pain, cramping, or bloating in your belly (abdomen).  · You have trouble   breathing.  · You are breathing very quickly.  · Your heart is beating very quickly.  · Your skin feels cold and clammy.  · You feel confused.  · You have signs of losing too much water in your body, such as:  ? Dark pee, very little pee, or no pee.  ? Cracked lips.  ? Dry mouth.  ? Sunken eyes.  ? Sleepiness.  ? Weakness.  These symptoms may be an emergency. Do not wait to see if the symptoms will go away. Get medical help right away. Call your local emergency services (911 in the [place] Do not drive yourself to the hospital.  Summary  · Nausea is feeling sick to your stomach or feeling that you are about to throw up (vomit). Vomiting is when food in your stomach is thrown up and out of the mouth.  · Follow instructions from your doctor about eating and drinking to keep from losing too much water in your body.  · Take over-the-counter and prescription medicines only as told by your doctor.  · Contact your doctor if your symptoms get worse or you have new symptoms.  · Keep all follow-up  visits as told by your doctor. This is important.  This information is not intended to replace advice given to you by your health care provider. Make sure you discuss any questions you have with your health care provider.  Document Released: 01/03/2008 Document Revised: 12/25/2017 Document Reviewed: 12/25/2017  Elsevier Interactive Patient Education © 2019 Elsevier Inc.

## 2018-10-03 NOTE — Progress Notes (Signed)
Established Patient Office Visit  Subjective:  Patient ID: Sharon Andrade, female    DOB: 29-Aug-1985  Age: 33 y.o. MRN: [id number]  CC:  Chief Complaint  Patient presents with  . Nausea    Since yesterday morning.  . Emesis  . Diabetes    HPI Sharon Andrade presents for nausea, vomiting and diarrhea that began yesterday morning and has been progressively improving over the last 24 hours. She reports multiple episodes of vomiting yesterday. She states that vomiting has subsided but nausea and watery stools are still present today. She also endorses some generalized abdominal discomfort. She denies bloody or dark or tarry stools or blood in her vomit. She has been able to tolerate sips of ginger ale and has been urinating without issue. She reports feeling hot at times but denies chills, body aches, cough, or sore throat.   Past Medical History:  Diagnosis Date  . Family history of ovarian cancer    11/19 cancer genetic testing letter sent  . Family history of pancreatic cancer     Past Surgical History:  Procedure Laterality Date  . ABDOMINAL HYSTERECTOMY    . COMBINED HYSTEROSCOPY DIAGNOSTIC / D&C     Several d/t multiple miscarriages  . TUBAL LIGATION      Family History  Problem Relation Age of Onset  . COPD Mother        smoker  . Ovarian cancer Mother   . Emphysema Father        smoker  . COPD Maternal Grandmother        smoker  . Colon cancer Maternal Grandfather   . Depression Paternal Grandfather        Shot himself; pt found him when she was 33yo  . Breast cancer Maternal Aunt   . Ovarian cancer Maternal Aunt   . Pancreatic cancer Maternal Uncle     Social History   Socioeconomic History  . Marital status: Married    Spouse name: Caryn Bee  . Number of children: 2  . Years of education: Not on file  . Highest education level: Not on file  Occupational History  . Not on file  Social Needs  . Financial resource strain: Not hard at all  . Food  insecurity:    Worry: Never true    Inability: Never true  . Transportation needs:    Medical: No    Non-medical: No  Tobacco Use  . Smoking status: Never Smoker  . Smokeless tobacco: Never Used  Substance and Sexual Activity  . Alcohol use: Never    Frequency: Never  . Drug use: Never  . Sexual activity: Yes    Partners: Male    Birth control/protection: Surgical  Lifestyle  . Physical activity:    Days per week: 3 days    Minutes per session: 30 min  . Stress: Not at all  Relationships  . Social connections:    Talks on phone: More than three times a week    Gets together: More than three times a week    Attends religious service: Never    Active member of club or organization: No    Attends meetings of clubs or organizations: Never    Relationship status: Married  . Intimate partner violence:    Fear of current or ex partner: No    Emotionally abused: No    Physically abused: No    Forced sexual activity: No  Other Topics Concern  . Not on file  Social History Narrative  35yo and 33yo; married to her husband    Outpatient Medications Prior to Visit  Medication Sig Dispense Refill  . albuterol (PROVENTIL HFA;VENTOLIN HFA) 108 (90 Base) MCG/ACT inhaler Inhale into the lungs.    Marland Kitchen buPROPion (WELLBUTRIN XL) 150 MG 24 hr tablet TAKE 1 TABLET (150 MG TOTAL) BY MOUTH DAILY. 15 tablet 0  . busPIRone (BUSPAR) 7.5 MG tablet Take 1 tablet PO at night daily.  After 7 days, you may increase to taking 1 tablet every 8 hours as needed for anxiety (Max dose 3 tablets in 1 day). 60 tablet 1  . Liraglutide -Weight Management (SAXENDA) 18 MG/3ML SOPN Inject 0.6 mg into the skin daily for 7 days, THEN 1.2 mg daily. 3 mL 1  . Multiple Vitamin (MULTIVITAMIN) tablet Take 1 tablet by mouth daily.    . polyethylene glycol (MIRALAX) packet Take 17 g by mouth daily as needed. 14 each 6   No facility-administered medications prior to visit.     Allergies  Allergen Reactions  .  Penicillins Rash    ROS Review of Systems  Constitutional: Negative.   HENT: Negative.   Eyes: Negative.   Respiratory: Negative.   Cardiovascular: Negative.   Gastrointestinal: Positive for abdominal pain, diarrhea and nausea.  Endocrine: Negative.   Genitourinary: Negative.   Musculoskeletal: Negative.   Skin: Negative.   Allergic/Immunologic: Negative.   Neurological: Negative.   Hematological: Negative.   Psychiatric/Behavioral: Negative.       Objective:    Physical Exam  Constitutional: She is oriented to person, place, and time. She appears well-developed and well-nourished. No distress.  HENT:  Head: Normocephalic and atraumatic.  Eyes: Pupils are equal, round, and reactive to light. Conjunctivae and EOM are normal.  Neck: Normal range of motion.  Cardiovascular: Normal rate, regular rhythm and normal heart sounds.  Pulmonary/Chest: Effort normal and breath sounds normal.  Abdominal: Soft. Bowel sounds are normal. She exhibits no mass. There is abdominal tenderness (RLQ). There is no rebound and no guarding.  Musculoskeletal: Normal range of motion.  Neurological: She is alert and oriented to person, place, and time. She has normal reflexes. No cranial nerve deficit.  Skin: Skin is warm and dry.  Psychiatric: She has a normal mood and affect. Her behavior is normal. Judgment and thought content normal.   BP 128/90 (BP Location: Left Arm, Patient Position: Sitting, Cuff Size: Normal)   Pulse (!) 105   Temp 98 F (36.7 C) (Oral)   Resp 16   Ht 5\' 7"  (1.702 m)   Wt 207 lb 4.8 oz (94 kg)   SpO2 97%   BMI 32.47 kg/m  Wt Readings from Last 3 Encounters:  10/03/18 207 lb 4.8 oz (94 kg)  08/02/18 213 lb 8 oz (96.8 kg)  07/03/18 212 lb (96.2 kg)    Health Maintenance Due  Topic Date Due  . TETANUS/TDAP  10/03/2002  . INFLUENZA VACCINE  02/28/2018    There are no preventive care reminders to display for this patient.  Lab Results  Component Value Date    TSH 1.38 06/06/2018   Lab Results  Component Value Date   WBC 7.6 10/03/2018   HGB 15.8 (H) 10/03/2018   HCT 46.4 (H) 10/03/2018   MCV 85.9 10/03/2018   PLT 340 10/03/2018   Lab Results  Component Value Date   NA 137 10/03/2018   K 4.3 10/03/2018   CO2 25 10/03/2018   GLUCOSE 87 10/03/2018   BUN 15 10/03/2018   CREATININE 0.74  10/03/2018   BILITOT 0.6 10/03/2018   ALKPHOS 67 10/03/2018   AST 19 10/03/2018   ALT 22 10/03/2018   PROT 8.1 10/03/2018   ALBUMIN 4.5 10/03/2018   CALCIUM 9.2 10/03/2018   ANIONGAP 8 10/03/2018   Lab Results  Component Value Date   CHOL 131 06/06/2018   Lab Results  Component Value Date   HDL 50 (L) 06/06/2018   Lab Results  Component Value Date   LDLCALC 68 06/06/2018   Lab Results  Component Value Date   TRIG 47 06/06/2018   Lab Results  Component Value Date   CHOLHDL 2.6 06/06/2018   No results found for: HGBA1C    Assessment & Plan:  Will [facility] for stat CBC and CMP due to RLQ pain. Did discuss option for CT for appendicitis rule out, and she declines.  I agree that best course of action is for CBC first, if elevated will consider CT.  Red flags for appendicitis/abdominal pain are discussed in detail including worsening of current symptoms, abdominal pain that is worsening, non-intractable vomiting or diarrhea.  Problem List Items Addressed This Visit    None    Visit Diagnoses    RLQ abdominal pain    -  Primary   Relevant Orders   CBC w/Diff/Platelet (Completed)   Comprehensive metabolic panel (Completed)   Diarrhea, unspecified type       Relevant Orders   CBC w/Diff/Platelet (Completed)   Comprehensive metabolic panel (Completed)   Non-intractable vomiting with nausea, unspecified vomiting type       Relevant Orders   CBC w/Diff/Platelet (Completed)   Comprehensive metabolic panel (Completed)      Meds ordered this encounter  Medications  . ondansetron (ZOFRAN) 4 MG tablet    Sig: Take 1 tablet  (4 mg total) by mouth every 8 (eight) hours as needed for nausea or vomiting.    Dispense:  20 tablet    Refill:  0    Order Specific Question:   Supervising Provider    Answer:   Alba Cory [[id number]]    Follow-up: Return if symptoms worsen or fail to improve, for depending on labs.    Doren Custard, FNP

## 2018-10-09 ENCOUNTER — Other Ambulatory Visit: Payer: Self-pay | Admitting: Family Medicine

## 2018-10-09 DIAGNOSIS — F331 Major depressive disorder, recurrent, moderate: Secondary | ICD-10-CM

## 2018-10-09 DIAGNOSIS — T1490XA Injury, unspecified, initial encounter: Secondary | ICD-10-CM

## 2018-11-01 ENCOUNTER — Ambulatory Visit: Payer: 59 | Admitting: Family Medicine

## 2018-11-12 ENCOUNTER — Encounter: Payer: Self-pay | Admitting: Family Medicine

## 2018-11-14 ENCOUNTER — Ambulatory Visit: Payer: 59 | Admitting: Family Medicine

## 2018-11-14 ENCOUNTER — Other Ambulatory Visit: Payer: Self-pay

## 2018-11-26 ENCOUNTER — Encounter: Payer: Self-pay | Admitting: Family Medicine

## 2018-11-26 NOTE — Telephone Encounter (Signed)
Please schedule

## 2018-11-26 NOTE — Telephone Encounter (Signed)
[phone] or [phone]

## 2018-11-26 NOTE — Telephone Encounter (Signed)
Patient is schedule 11/27/18 with AMS

## 2018-11-27 ENCOUNTER — Other Ambulatory Visit: Payer: Self-pay

## 2018-11-27 ENCOUNTER — Encounter: Payer: Self-pay | Admitting: Obstetrics and Gynecology

## 2018-11-27 ENCOUNTER — Ambulatory Visit (INDEPENDENT_AMBULATORY_CARE_PROVIDER_SITE_OTHER): Payer: 59 | Admitting: Obstetrics and Gynecology

## 2018-11-27 DIAGNOSIS — R102 Pelvic and perineal pain: Secondary | ICD-10-CM

## 2018-11-27 DIAGNOSIS — N7011 Chronic salpingitis: Secondary | ICD-10-CM | POA: Diagnosis not present

## 2018-11-27 NOTE — Progress Notes (Signed)
I connected with Sharon Andrade  on 12/02/18 at  1:30 PM EDT by telephone and verified that I am speaking with the correct person using two identifiers.   I discussed the limitations, risks, security and privacy concerns of performing an evaluation and management service by telephone and the availability of in person appointments. I also discussed with the patient that there may be a patient responsible charge related to this service. The patient expressed understanding and agreed to proceed.  The patient was at home I spoke with the patient from my workstation phone The names of people involved in this encounter were: Sharon Andrade , and Vena Austria   Obstetrics & Gynecology Office Visit   Chief Complaint:  Chief Complaint  Patient presents with   Ovarian Cyst    Follow up discuss surgery options    History of Present Illness: 33 year old s/p prior TVH with pelvic pain and previously imaged left hydrosalpinx.  In the interim since our last visit patient states she had noted acute exacerbation in pain and is willing to move forward with removal in the hopes of achieving amelioration.     Review of Systems: Review of Systems  Constitutional: Negative.   Gastrointestinal: Positive for abdominal pain and nausea.  Genitourinary: Negative.      Past Medical History:  Past Medical History:  Diagnosis Date   Family history of ovarian cancer    11/19 cancer genetic testing letter sent   Family history of pancreatic cancer     Past Surgical History:  Past Surgical History:  Procedure Laterality Date   ABDOMINAL HYSTERECTOMY     COMBINED HYSTEROSCOPY DIAGNOSTIC / D&C     Several d/t multiple miscarriages   TUBAL LIGATION      Gynecologic History: No LMP recorded. Patient has had a hysterectomy.  Obstetric History: [id number]  Family History:  Family History  Problem Relation Age of Onset   COPD Mother        smoker   Ovarian cancer Mother    Emphysema  Father        smoker   COPD Maternal Grandmother        smoker   Colon cancer Maternal Grandfather    Depression Paternal Grandfather        Shot himself; pt found him when she was 33yo   Breast cancer Maternal Aunt    Ovarian cancer Maternal Aunt    Pancreatic cancer Maternal Uncle     Social History:  Social History   Socioeconomic History   Marital status: Married    Spouse name: Caryn Bee   Number of children: 2   Years of education: Not on file   Highest education level: Not on file  Occupational History   Not on file  Social Needs   Financial resource strain: Not hard at all   Food insecurity:    Worry: Never true    Inability: Never true   Transportation needs:    Medical: No    Non-medical: No  Tobacco Use   Smoking status: Never Smoker   Smokeless tobacco: Never Used  Substance and Sexual Activity   Alcohol use: Never    Frequency: Never   Drug use: Never   Sexual activity: Yes    Partners: Male    Birth control/protection: Surgical    Comment: Hysterectomy  Lifestyle   Physical activity:    Days per week: 3 days    Minutes per session: 30 min   Stress: Not at all  Relationships   Social connections:    Talks on phone: More than three times a week    Gets together: More than three times a week    Attends religious service: Never    Active member of club or organization: No    Attends meetings of clubs or organizations: Never    Relationship status: Married   Intimate partner violence:    Fear of current or ex partner: No    Emotionally abused: No    Physically abused: No    Forced sexual activity: No  Other Topics Concern   Not on file  Social History Narrative   35yo and 33yo; married to her husband    Allergies:  Allergies  Allergen Reactions   Penicillins Rash    Medications: Prior to Admission medications   Medication Sig Start Date End Date Taking? Authorizing Provider  albuterol (PROVENTIL HFA;VENTOLIN HFA)  108 (90 Base) MCG/ACT inhaler Inhale into the lungs. 10/01/17  Yes [provider]  buPROPion (WELLBUTRIN XL) 150 MG 24 hr tablet TAKE 1 TABLET (150 MG TOTAL) BY MOUTH DAILY. 10/02/18  Yes Doren Custard, FNP  busPIRone (BUSPAR) 7.5 MG tablet TAKE 1 TABLET BY MOUTH NIGHTLY. AFTER 7 DAYS, MAY INCREASE TO 1 TABLET BY MOUTH EVERY 8 HOURS AS NEEDED FOR ANXIETY (MAX 3 TABLETS PER DAY) 10/09/18  Yes Doren Custard, FNP  Liraglutide -Weight Management (SAXENDA) 18 MG/3ML SOPN Inject 0.6 mg into the skin daily for 7 days, THEN 1.2 mg daily. 09/16/18 12/22/18 Yes Doren Custard, FNP  Multiple Vitamin (MULTIVITAMIN) tablet Take 1 tablet by mouth daily.   Yes [provider]  ondansetron (ZOFRAN) 4 MG tablet Take 1 tablet (4 mg total) by mouth every 8 (eight) hours as needed for nausea or vomiting. 10/03/18  Yes Doren Custard, FNP  polyethylene glycol [facility]) packet Take 17 g by mouth daily as needed. 06/25/18  Yes Schuman, Jaquelyn Bitter, MD    Physical Exam Vitals: There were no vitals filed for this visit. No LMP recorded. Patient has had a hysterectomy.  No physical exam as this was a remote telephone visit to promote social distancing during the current COVID-19 Pandemic   Assessment: 33 y.o. [id number] with chronic pelvic pain and left hydrosalpinx  Plan: Problem List Items Addressed This Visit    None    Visit Diagnoses    Hydrosalpinx    -  Primary   Pelvic pain in female         Discussed surgical options as well as likelihood of alleviating pain.  I discussed that my recommendation would be salpingectomy to remove any residual hydrosalpinx with decision to proceed with oophorectomy based on the appearance of the ovary.  We also discussed the current moratorium on elective procedures and the uncertain date of cases being run again. I have had a careful discussion with this patient about all the options available and the risk/benefits of each. I have fully informed this patient that a  laparoscopy may subject her to a variety of discomforts and risks: She understands that most patients have surgery with little difficulty, but problems can happen ranging from minor to fatal. These include nausea, vomiting, pain, bleeding, infection, poor healing, hernia, or formation of adhesions. Unexpected reactions may occur from any drug or anesthetic given. Unintended injury may occur to other pelvic or abdominal structures such as Fallopian tubes, ovaries, bladder, ureter (tube from kidney to bladder), or bowel. Nerves going from the pelvis to the legs may  be injured. Any such injury may require immediate or later additional surgery to correct the problem. Excessive blood loss requiring transfusion is very unlikely but possible. Dangerous blood clots may form in the legs or lungs. Physical and sexual activity will be restricted in varying degrees for an indeterminate period of time but most often 2-4 weeks. She understands that the plan is to do this laparoscopically, however, there is a chance that this will need to be performed via a larger incision. Finally, she understands that it is impossible to list every possible undesirable effect and that the condition for which surgery is done is not always cured or significantly improved, and in rare cases may be even worsen. Ample time was given to answer all questions.  2) Telephone Time 15:46  3) Follow up pending OR running again  Vena AustriaAndreas Tamarion Haymond, MD, Merlinda FrederickFACOG [facility], [facility] 11/27/2018, 1:57 PM

## 2018-12-02 ENCOUNTER — Encounter: Payer: Self-pay | Admitting: Family Medicine

## 2018-12-12 ENCOUNTER — Other Ambulatory Visit: Payer: Self-pay | Admitting: Obstetrics and Gynecology

## 2018-12-12 MED ORDER — IBUPROFEN 600 MG PO TABS: 600.0000 mg | ORAL_TABLET | Freq: Four times a day (QID) | ORAL | 3 refills | Status: DC | PRN

## 2018-12-12 MED ORDER — TRAMADOL HCL 50 MG PO TABS: 50.0000 mg | ORAL_TABLET | Freq: Four times a day (QID) | ORAL | 0 refills | Status: DC | PRN

## 2018-12-13 ENCOUNTER — Telehealth: Payer: Self-pay | Admitting: Obstetrics and Gynecology

## 2018-12-13 NOTE — Telephone Encounter (Signed)
Patient is aware of H&P at [facility] on 01/23/19 @ 8:10am, Pre-admit Testing and COVID testing to be scheduled, and OR on 01/30/19. Patient is aware she may be asked to quarantine after COVID testing. Patient is aware she may receive calls from the [facility] and [facility]. Patient confirmed UMR and no secondary insurance. Patient is aware of phone check-in/ mask/ no visitors for [facility] visit.

## 2018-12-13 NOTE — Telephone Encounter (Signed)
-----   Message from Vena Austria, MD sent at 12/02/2018  1:05 PM EDT ----- Surgery Date:   LOS: same day surgery  Surgery Booking Request Patient Full Name: Sharon Andrade MRN: [id number]  DOB: 12/18/1985  Surgeon: Vena Austria, MD  Requested Surgery Date and Time: depending on when OR open up high priority because of pain Primary Diagnosis and Code: Pelvic pain Secondary Diagnosis and Code: Left hydrosalpinx Surgical Procedure: laparoscopic left salpingectomy, possible left ovarian cystectomy vs oophorectomy L&D Notification:N/A Admission Status: same day surgery Length of Surgery: 1.5hrs Special Case Needs: none H&P:  (date) Phone Interview or Office Pre-Admit: phone interview Interpreter: No Language: English Medical Clearance: No Special Scheduling Instructions:

## 2018-12-20 NOTE — Telephone Encounter (Signed)
Not sure if this is one of the surgeries already scheduled but she would like to move it back to [employer]

## 2018-12-31 ENCOUNTER — Telehealth: Payer: Self-pay

## 2018-12-31 NOTE — Telephone Encounter (Signed)
FMLA/DISABILITY form for [employer] filled out, signature obtained and given to KT for processing. 

## 2019-01-05 ENCOUNTER — Encounter: Payer: Self-pay | Admitting: Family Medicine

## 2019-01-08 ENCOUNTER — Other Ambulatory Visit: Payer: Self-pay

## 2019-01-08 ENCOUNTER — Encounter: Payer: Self-pay | Admitting: Family Medicine

## 2019-01-08 ENCOUNTER — Telehealth: Payer: Self-pay | Admitting: Obstetrics and Gynecology

## 2019-01-08 ENCOUNTER — Ambulatory Visit (INDEPENDENT_AMBULATORY_CARE_PROVIDER_SITE_OTHER): Payer: 59 | Admitting: Family Medicine

## 2019-01-08 DIAGNOSIS — K5901 Slow transit constipation: Secondary | ICD-10-CM | POA: Insufficient documentation

## 2019-01-08 DIAGNOSIS — K921 Melena: Secondary | ICD-10-CM | POA: Insufficient documentation

## 2019-01-08 MED ORDER — POLYETHYLENE GLYCOL 3350 17 G PO PACK: PACK | ORAL | 1 refills | Status: DC

## 2019-01-08 MED ORDER — DOCUSATE SODIUM 100 MG PO CAPS: 100.0000 mg | ORAL_CAPSULE | Freq: Two times a day (BID) | ORAL | 0 refills | Status: DC | PRN

## 2019-01-08 NOTE — Telephone Encounter (Signed)
Rec'd voicemail message from patient. Needs to cancel surgery for now, having GI problems and needs to see specialist. Will call back to reschedule. Patient said she cannot take the tramadol, it makes her chest hurt, so will stick w/ ibuprofen. Patient said she has taken husband's toradol, that it seems to work, and didn't know if Dr. Georgianne Fick would be willing to prescribe that. Phone# [phone].

## 2019-01-08 NOTE — Progress Notes (Signed)
Name: Sharon Andrade   MRN: [id number]    DOB: Oct 07, 1985   Date:01/08/2019       Progress Note  Subjective  Chief Complaint  Chief Complaint  Patient presents with  . Referral    rectal bleeding    I connected with  Tawni Levy  on 01/08/19 at  8:40 AM EDT by a video enabled telemedicine application and verified that I am speaking with the correct person using two identifiers.  I discussed the limitations of evaluation and management by telemedicine and the availability of in person appointments. The patient expressed understanding and agreed to proceed. Staff also discussed with the patient that there may be a patient responsible charge related to this service. Patient Location: Home Provider Location: Home Additional Individuals present: None  HPI  Pt presents to follow up on RLQ abdominal pain that has been ongoing sine October 01 2018.  She has been having nausea, but no vomiting, no diarrhea, no fevers or chills; is having significant constipation.  She recently started seeing dark and tarry stools and some bright red blood - worse when she has to strain.  She did try miralax just one time and this did not help.  She is going several days without a BM - is still passing flatus regularly.   She is being evaluated by Dr. Georgianne Fick for adneal cyst; has had hysterectomy in the past.  Has laparoscopic LEFt salpingectomy for 01/30/2019, but patient is hesitant to have this done.   Patient Active Problem List   Diagnosis Date Noted  . Abnormal vaginal bleeding 06/06/2018  . Trauma in childhood 06/06/2018    Social History   Tobacco Use  . Smoking status: Never Smoker  . Smokeless tobacco: Never Used  Substance Use Topics  . Alcohol use: Never    Frequency: Never     Current Outpatient Medications:  .  albuterol (PROVENTIL HFA;VENTOLIN HFA) 108 (90 Base) MCG/ACT inhaler, Inhale into the lungs., Disp: , Rfl:  .  buPROPion (WELLBUTRIN XL) 150 MG 24 hr tablet, TAKE 1 TABLET  (150 MG TOTAL) BY MOUTH DAILY., Disp: 15 tablet, Rfl: 0 .  busPIRone (BUSPAR) 7.5 MG tablet, TAKE 1 TABLET BY MOUTH NIGHTLY. AFTER 7 DAYS, MAY INCREASE TO 1 TABLET BY MOUTH EVERY 8 HOURS AS NEEDED FOR ANXIETY (MAX 3 TABLETS PER DAY), Disp: 60 tablet, Rfl: 0 .  ibuprofen (ADVIL) 600 MG tablet, Take 1 tablet (600 mg total) by mouth every 6 (six) hours as needed., Disp: 60 tablet, Rfl: 3 .  Multiple Vitamin (MULTIVITAMIN) tablet, Take 1 tablet by mouth daily., Disp: , Rfl:  .  ondansetron (ZOFRAN) 4 MG tablet, Take 1 tablet (4 mg total) by mouth every 8 (eight) hours as needed for nausea or vomiting., Disp: 20 tablet, Rfl: 0 .  traMADol (ULTRAM) 50 MG tablet, Take 1 tablet (50 mg total) by mouth every 6 (six) hours as needed for severe pain., Disp: 20 tablet, Rfl: 0 .  polyethylene glycol (MIRALAX) packet, Take 17 g by mouth daily as needed., Disp: 14 each, Rfl: 6  Allergies  Allergen Reactions  . Penicillins Rash    I personally reviewed active problem list, medication list, allergies, notes from last encounter, lab results with the patient/caregiver today.  ROS  Ten systems reviewed and is negative except as mentioned in HPI  Objective  Virtual encounter, vitals not obtained.  There is no height or weight on file to calculate BMI.  Nursing Note and Vital Signs reviewed.  Physical Exam  Constitutional: Patient appears well-developed and well-nourished. No distress.  HENT: Head: Normocephalic and atraumatic.  Neck: Normal range of motion. Pulmonary/Chest: Effort normal. No respiratory distress. Speaking in complete sentences Neurological: Pt is alert and oriented to person, place, and time. Coordination, speech and gait are normal.  Psychiatric: Patient has a normal mood and affect. behavior is normal. Judgment and thought content normal. Abdomen: Patient does palpate her abdomen and states there is some tenderness over the RLQ.  Abdomen appears soft, but my exam is significantly  limited by video.  No results found for this or any previous visit (from the past 72 hour(s)).  Assessment & Plan  1. Constipation by delayed colonic transit - Ambulatory referral to Gastroenterology - polyethylene glycol (MIRALAX) 17 g packet; Use twice daily until stools are regular, then once daily.  Dispense: 100 each; Refill: 1 - docusate sodium (COLACE) 100 MG capsule; Take 1 capsule (100 mg total) by mouth 2 (two) times daily as needed for mild constipation or moderate constipation.  Dispense: 20 capsule; Refill: 0  2. Blood in stool - Ambulatory referral to Gastroenterology   -Red flags and when to present for emergency care or RTC including fever >101.78F, chest pain, shortness of breath, new/worsening/un-resolving symptoms, taught abdomen, vomiting, cessation or flatus, significant amount of blood in stool, no BM for >4 days reviewed with patient at time of visit. Follow up and care instructions discussed and provided in AVS. - I discussed the assessment and treatment plan with the patient. The patient was provided an opportunity to ask questions and all were answered. The patient agreed with the plan and demonstrated an understanding of the instructions.  I provided 16 minutes of non-face-to-face time during this encounter.  Doren CustardEmily E Boyce, FNP

## 2019-01-11 ENCOUNTER — Encounter: Payer: Self-pay | Admitting: Family Medicine

## 2019-01-14 ENCOUNTER — Other Ambulatory Visit: Payer: Self-pay | Admitting: Family Medicine

## 2019-01-14 ENCOUNTER — Encounter: Payer: Self-pay | Admitting: Gastroenterology

## 2019-01-14 ENCOUNTER — Ambulatory Visit (INDEPENDENT_AMBULATORY_CARE_PROVIDER_SITE_OTHER): Payer: 59 | Admitting: Gastroenterology

## 2019-01-14 ENCOUNTER — Other Ambulatory Visit: Payer: Self-pay

## 2019-01-14 DIAGNOSIS — T1490XA Injury, unspecified, initial encounter: Secondary | ICD-10-CM

## 2019-01-14 DIAGNOSIS — K625 Hemorrhage of anus and rectum: Secondary | ICD-10-CM

## 2019-01-14 DIAGNOSIS — Z6831 Body mass index (BMI) 31.0-31.9, adult: Secondary | ICD-10-CM

## 2019-01-14 DIAGNOSIS — E6609 Other obesity due to excess calories: Secondary | ICD-10-CM

## 2019-01-14 DIAGNOSIS — F331 Major depressive disorder, recurrent, moderate: Secondary | ICD-10-CM

## 2019-01-14 DIAGNOSIS — K5900 Constipation, unspecified: Secondary | ICD-10-CM | POA: Diagnosis not present

## 2019-01-14 DIAGNOSIS — R109 Unspecified abdominal pain: Secondary | ICD-10-CM

## 2019-01-14 MED ORDER — NA SULFATE-K SULFATE-MG SULF 17.5-3.13-1.6 GM/177ML PO SOLN: 1.0000 | Freq: Once | ORAL | 0 refills | Status: AC

## 2019-01-14 NOTE — Progress Notes (Signed)
Melodie BouillonVarnita Caroleann Casler [address]  [address]  [place], [place] [address]  Main: [phone]  Fax: [phone]   Gastroenterology Consultation  Referring Provider:     Doren CustardBoyce, Emily E, FNP Primary Care Physician:  Doren CustardBoyce, Emily E, FNP Reason for Consultation:     Constipation, blood in stool        HPI:   Virtual Visit via Video Note  I connected with patient on 01/14/19 at  9:00 AM EDT by video ([email or web address]) and verified that I am speaking with the correct person using two identifiers.   I discussed the limitations, risks, security and privacy concerns of performing an evaluation and management service by video and the availability of in person appointments. I also discussed with the patient that there may be a patient responsible charge related to this service. The patient expressed understanding and agreed to proceed.  Location of the patient: Home Location of provider: Home Participating persons: Patient and provider only (Nursing staff checked in patient via phone but were not physically involved in the video interaction - see their notes)   History of Present Illness: Chief Complaint  Patient presents with  . New Patient (Initial Visit)    referred for constipation/blood in stool    Sharon Andrade is a 33 y.o. y/o female referred for consultation & management  by Dr. Annye AsaBoyce, Gerome ApleyEmily E, FNP.  Patient reports 1 year history of constipation, and states can goes days without a bowel movement.  Was started on MiraLAX and Colace by primary care provider, and took it twice daily for over a week and this did not do anything.  Took mag citrate and this helped.  Also reporting 3 to 3373-month history of bright red blood per rectum with bowel movements, with blood within the stool.  No anemia on blood work.  Does report 1-2 episodes of seeing black tarry stool, 1 to 2 days over the last month.  However, this has resolved since then.  Reports right lower quadrant abdominal pain as well,  dull, nonradiating, 5/10, intermittent.  Improves after a bowel movement.  No dysphagia or heartburn.  No immediate family members with colon cancer.  Patient has a listed on her medication list, but states she takes it sparingly and when she does take it only takes 1 pill.  Last time she took it was over a week ago and only took 1 pill.  Past Medical History:  Diagnosis Date  . Family history of ovarian cancer    11/19 cancer genetic testing letter sent  . Family history of pancreatic cancer     Past Surgical History:  Procedure Laterality Date  . ABDOMINAL HYSTERECTOMY    . COMBINED HYSTEROSCOPY DIAGNOSTIC / D&C     Several d/t multiple miscarriages  . TUBAL LIGATION      Prior to Admission medications   Medication Sig Start Date End Date Taking? Authorizing Provider  albuterol (PROVENTIL HFA;VENTOLIN HFA) 108 (90 Base) MCG/ACT inhaler Inhale into the lungs. 10/01/17   [provider]  buPROPion (WELLBUTRIN XL) 150 MG 24 hr tablet TAKE 1 TABLET (150 MG TOTAL) BY MOUTH DAILY. 10/02/18   Doren CustardBoyce, Emily E, FNP  busPIRone (BUSPAR) 7.5 MG tablet TAKE 1 TABLET BY MOUTH NIGHTLY. AFTER 7 DAYS, MAY INCREASE TO 1 TABLET BY MOUTH EVERY 8 HOURS AS NEEDED FOR ANXIETY (MAX 3 TABLETS PER DAY) 10/09/18   Doren CustardBoyce, Emily E, FNP  docusate sodium (COLACE) 100 MG capsule Take 1 capsule (100 mg total) by mouth 2 (  two) times daily as needed for mild constipation or moderate constipation. 01/08/19   Doren CustardBoyce, Emily E, FNP  ibuprofen (ADVIL) 600 MG tablet Take 1 tablet (600 mg total) by mouth every 6 (six) hours as needed. 12/12/18   Vena AustriaStaebler, Andreas, MD  Multiple Vitamin (MULTIVITAMIN) tablet Take 1 tablet by mouth daily.    [provider]  ondansetron (ZOFRAN) 4 MG tablet Take 1 tablet (4 mg total) by mouth every 8 (eight) hours as needed for nausea or vomiting. 10/03/18   Doren CustardBoyce, Emily E, FNP  polyethylene glycol (MIRALAX) 17 g packet Use twice daily until stools are regular, then once daily. 01/08/19    Doren CustardBoyce, Emily E, FNP  traMADol (ULTRAM) 50 MG tablet Take 1 tablet (50 mg total) by mouth every 6 (six) hours as needed for severe pain. 12/12/18   Vena AustriaStaebler, Andreas, MD    Family History  Problem Relation Age of Onset  . COPD Mother        smoker  . Ovarian cancer Mother   . Emphysema Father        smoker  . COPD Maternal Grandmother        smoker  . Colon cancer Maternal Grandfather   . Depression Paternal Grandfather        Shot himself; pt found him when she was 33yo  . Breast cancer Maternal Aunt   . Ovarian cancer Maternal Aunt   . Pancreatic cancer Maternal Uncle      Social History   Tobacco Use  . Smoking status: Never Smoker  . Smokeless tobacco: Never Used  Substance Use Topics  . Alcohol use: Never    Frequency: Never  . Drug use: Never    Allergies as of 01/14/2019 - Review Complete 01/08/2019  Allergen Reaction Noted  . Penicillins Rash 07/18/2013    Review of Systems:    All systems reviewed and negative except where noted in HPI.   Observations/Objective:  Labs: CBC    Component Value Date/Time   WBC 7.6 10/03/2018 1009   RBC 5.40 (H) 10/03/2018 1009   HGB 15.8 (H) 10/03/2018 1009   HGB 7.9 (L) 03/01/2014 0442   HCT 46.4 (H) 10/03/2018 1009   HCT 23.3 (L) 03/01/2014 0442   PLT 340 10/03/2018 1009   PLT 252 03/01/2014 0442   MCV 85.9 10/03/2018 1009   MCV 89 03/01/2014 0442   MCH 29.3 10/03/2018 1009   MCHC 34.1 10/03/2018 1009   RDW 12.2 10/03/2018 1009   RDW 13.6 03/01/2014 0442   LYMPHSABS 2.0 10/03/2018 1009   LYMPHSABS 2.1 03/01/2014 0442   MONOABS 0.6 10/03/2018 1009   MONOABS 0.9 03/01/2014 0442   EOSABS 0.2 10/03/2018 1009   EOSABS 0.5 03/01/2014 0442   BASOSABS 0.1 10/03/2018 1009   BASOSABS 0.0 03/01/2014 0442   CMP     Component Value Date/Time   NA 137 10/03/2018 1009   NA 141 03/01/2014 0442   K 4.3 10/03/2018 1009   K 4.0 03/01/2014 0442   CL 104 10/03/2018 1009   CL 108 (H) 03/01/2014 0442   CO2 25 10/03/2018  1009   CO2 27 03/01/2014 0442   GLUCOSE 87 10/03/2018 1009   GLUCOSE 96 03/01/2014 0442   BUN 15 10/03/2018 1009   BUN 10 03/01/2014 0442   CREATININE 0.74 10/03/2018 1009   CREATININE 0.78 06/06/2018 1422   CALCIUM 9.2 10/03/2018 1009   CALCIUM 7.9 (L) 03/01/2014 0442   PROT 8.1 10/03/2018 1009   PROT 6.8 02/27/2014 1521  ALBUMIN 4.5 10/03/2018 1009   ALBUMIN 3.6 02/27/2014 1521   AST 19 10/03/2018 1009   AST 29 02/27/2014 1521   ALT 22 10/03/2018 1009   ALT 39 02/27/2014 1521   ALKPHOS 67 10/03/2018 1009   ALKPHOS 73 02/27/2014 1521   BILITOT 0.6 10/03/2018 1009   BILITOT 0.7 02/27/2014 1521   GFRNONAA >60 10/03/2018 1009   GFRNONAA 99 06/06/2018 1422   GFRAA >60 10/03/2018 1009   GFRAA 115 06/06/2018 1422    Imaging Studies: No results found.  Assessment and Plan:   Sharon Andrade is a 33 y.o. y/o female has been referred for constipation and bright red blood per rectum  Assessment and Plan: Patient's bright red per rectum is likely related to her constipation and possible internal hemorrhoids  however, as the symptoms are new, further evaluation with colonoscopy is indicated to rule out any underlying lesions  In addition, an EGD is also indicated given intermittent reports of black stools. However, these have resolved and given normal Hemoglobin, these are unlikely to represent true melena.   Pt advised to avoid NSAIDs  I have discussed alternative options, risks & benefits,  which include, but are not limited to, bleeding, infection, perforation,respiratory complication & drug reaction.  The patient agrees with this plan & written consent will be obtained.    Pt will need to be started on pharmacologic therapy fo constipation post procedure.   High-fiber diet encouraged  Follow Up Instructions: Follow-up in 2 to 3 weeks post procedure  I discussed the assessment and treatment plan with the patient. The patient was provided an opportunity to ask questions  and all were answered. The patient agreed with the plan and demonstrated an understanding of the instructions.   The patient was advised to call back or seek an in-person evaluation if the symptoms worsen or if the condition fails to improve as anticipated.  I provided 30 minutes of face-to-face time via video software during this encounter.  Additional time was spent in reviewing patient's chart, placing orders etc.   Virgel Manifold, MD  Speech recognition software was used to dictate the above note.

## 2019-01-14 NOTE — Addendum Note (Signed)
Addended by: Earl Lagos on: 01/14/2019 10:31 AM   Modules accepted: Orders, SmartSet

## 2019-01-20 ENCOUNTER — Encounter: Payer: Self-pay | Admitting: Family Medicine

## 2019-01-21 ENCOUNTER — Other Ambulatory Visit: Payer: Self-pay

## 2019-01-21 ENCOUNTER — Encounter: Payer: 59 | Admitting: Obstetrics and Gynecology

## 2019-01-21 ENCOUNTER — Ambulatory Visit (INDEPENDENT_AMBULATORY_CARE_PROVIDER_SITE_OTHER): Payer: 59 | Admitting: Gastroenterology

## 2019-01-21 ENCOUNTER — Encounter: Payer: Self-pay | Admitting: Gastroenterology

## 2019-01-21 VITALS — BP 114/76 | HR 105 | Temp 98.2°F | Ht 67.0 in | Wt 223.4 lb

## 2019-01-21 DIAGNOSIS — R112 Nausea with vomiting, unspecified: Secondary | ICD-10-CM

## 2019-01-21 DIAGNOSIS — R109 Unspecified abdominal pain: Secondary | ICD-10-CM

## 2019-01-21 DIAGNOSIS — R1084 Generalized abdominal pain: Secondary | ICD-10-CM

## 2019-01-21 MED ORDER — OMEPRAZOLE 20 MG PO CPDR: 20.0000 mg | DELAYED_RELEASE_CAPSULE | Freq: Every day | ORAL | 0 refills | Status: AC

## 2019-01-21 NOTE — Progress Notes (Signed)
Sharon BouillonVarnita Inika Bellanger, MD [address]  [address]  [place], [place] [address]  Main: [phone]  Fax: [phone]   Primary Care Physician: Doren CustardBoyce, Emily E, FNP   Chief complaint: Abdominal pain  HPI: Sharon Andrade is a 33 y.o. female recently evaluated with a virtual visit when she complained of 1 year history of constipation, and intermittent bright red blood per rectum with no anemia on blood work.  She also reported intermittently seeing black stool on last visit.  Patient is scheduled for an EGD and colonoscopy next week due to her symptoms.  She is being seen in clinic today as she contacted us due to throwing up blood.  Burning sensation in throat.  She reports symptoms started 3 days ago.  The episode occurred at 1 AM on Sunday night, with 2 episodes of emesis, first 1 with no blood, second 1 with some blood streaks.  No further emesis since then.  Also reports right lower quadrant abdominal pain which she states is chronic and intermittent but has been more severe over the onset of her symptoms this week.  No fever or chills.  Has not had a bowel movement in 2 days.  No diarrhea.  No sick contacts.  Current Outpatient Medications  Medication Sig Dispense Refill  . albuterol (PROVENTIL HFA;VENTOLIN HFA) 108 (90 Base) MCG/ACT inhaler Inhale into the lungs.    Marland Kitchen. buPROPion (WELLBUTRIN XL) 150 MG 24 hr tablet TAKE 1 TABLET (150 MG TOTAL) BY MOUTH DAILY. (Patient not taking: Reported on 01/14/2019) 15 tablet 0  . busPIRone (BUSPAR) 7.5 MG tablet TAKE 1 TABLET BY MOUTH NIGHTLY. AFTER 7 DAYS, MAY INCREASE TO 1 TABLET BY MOUTH EVERY 8 HOURS AS NEEDED FOR ANXIETY (MAX 3 TABLETS PER DAY) (Patient not taking: Reported on 01/14/2019) 60 tablet 0  . docusate sodium (COLACE) 100 MG capsule Take 1 capsule (100 mg total) by mouth 2 (two) times daily as needed for mild constipation or moderate constipation. 20 capsule 0  . ibuprofen (ADVIL) 600 MG tablet Take 1 tablet (600 mg total) by mouth  every 6 (six) hours as needed. 60 tablet 3  . Multiple Vitamin (MULTIVITAMIN) tablet Take 1 tablet by mouth daily.    . ondansetron (ZOFRAN) 4 MG tablet Take 1 tablet (4 mg total) by mouth every 8 (eight) hours as needed for nausea or vomiting. (Patient not taking: Reported on 01/14/2019) 20 tablet 0  . polyethylene glycol (MIRALAX) 17 g packet Use twice daily until stools are regular, then once daily. 100 each 1   No current facility-administered medications for this visit.     Allergies as of 01/21/2019 - Review Complete 01/14/2019  Allergen Reaction Noted  . Penicillins Rash 07/18/2013    ROS:  General: Negative for anorexia, weight loss, fever, chills, fatigue, weakness. ENT: Negative for hoarseness, difficulty swallowing , nasal congestion. CV: Negative for chest pain, angina, palpitations, dyspnea on exertion, peripheral edema.  Respiratory: Negative for dyspnea at rest, dyspnea on exertion, cough, sputum, wheezing.  GI: See history of present illness. GU:  Negative for dysuria, hematuria, urinary incontinence, urinary frequency, nocturnal urination.  Endo: Negative for unusual weight change.    Physical Examination:  Vitals:   01/21/19 1320  BP: 114/76  Pulse: (!) 105  Weight: 223 lb 6.4 oz (101.3 kg)  Height: 5\' 7"  (1.702 m)    General: Well-nourished, well-developed in no acute distress.  Eyes: No icterus. Conjunctivae pink. Mouth: Oropharyngeal mucosa moist and pink , no lesions erythema or exudate.  Neck: Supple, Trachea midline Abdomen: Bowel sounds are normal, nontender, nondistended, no hepatosplenomegaly or masses, no abdominal bruits or hernia , no rebound or guarding.   Extremities: No lower extremity edema. No clubbing or deformities. Neuro: Alert and oriented x 3.  Grossly intact. Skin: Warm and dry, no jaundice.   Psych: Alert and cooperative, normal mood and affect.   Labs: CMP     Component Value Date/Time   NA 137 10/03/2018 1009   NA 141  03/01/2014 0442   K 4.3 10/03/2018 1009   K 4.0 03/01/2014 0442   CL 104 10/03/2018 1009   CL 108 (H) 03/01/2014 0442   CO2 25 10/03/2018 1009   CO2 27 03/01/2014 0442   GLUCOSE 87 10/03/2018 1009   GLUCOSE 96 03/01/2014 0442   BUN 15 10/03/2018 1009   BUN 10 03/01/2014 0442   CREATININE 0.74 10/03/2018 1009   CREATININE 0.78 06/06/2018 1422   CALCIUM 9.2 10/03/2018 1009   CALCIUM 7.9 (L) 03/01/2014 0442   PROT 8.1 10/03/2018 1009   PROT 6.8 02/27/2014 1521   ALBUMIN 4.5 10/03/2018 1009   ALBUMIN 3.6 02/27/2014 1521   AST 19 10/03/2018 1009   AST 29 02/27/2014 1521   ALT 22 10/03/2018 1009   ALT 39 02/27/2014 1521   ALKPHOS 67 10/03/2018 1009   ALKPHOS 73 02/27/2014 1521   BILITOT 0.6 10/03/2018 1009   BILITOT 0.7 02/27/2014 1521   GFRNONAA >60 10/03/2018 1009   GFRNONAA 99 06/06/2018 1422   GFRAA >60 10/03/2018 1009   GFRAA 115 06/06/2018 1422   Lab Results  Component Value Date   WBC 7.6 10/03/2018   HGB 15.8 (H) 10/03/2018   HCT 46.4 (H) 10/03/2018   MCV 85.9 10/03/2018   PLT 340 10/03/2018    Imaging Studies: No results found.  Assessment and Plan:   Sharon Andrade is a 33 y.o. y/o female with abdominal pain and nausea vomiting  Patient is scheduled for her procedures next week  However, due to her new symptoms of nausea and vomiting will order labs today and CT scan this week  Her isolated episode of blood streaks with emesis is most consistent with possible Mallory-Weiss tear or esophagitis  If hemoglobin today is normal, no indication for emergent procedures and will proceed with planned procedures next week.  Patient also reports exacerbation of reflux over the last week including burning sensation in throat, will start PPI  (Risks of PPI use were discussed with patient including bone loss, C. Diff diarrhea, pneumonia, infections, CKD, electrolyte abnormalities.  If clinically possible based on symptoms, goal would be to maintain patient on the  lowest dose possible, or discontinue the medication with institution of acid reflux lifestyle modifications over time. Pt. Verbalizes understanding and chooses to continue the medication.)  High-fiber diet and MiraLAX use encouraged as well  Dr Vonda Antigua

## 2019-01-22 LAB — CBC
Hematocrit: 41.9 % (ref 34.0–46.6)
Hemoglobin: 14.3 g/dL (ref 11.1–15.9)
MCH: 29.6 pg (ref 26.6–33.0)
MCHC: 34.1 g/dL (ref 31.5–35.7)
MCV: 87 fL (ref 79–97)
Platelets: 334 10*3/uL (ref 150–450)
RBC: 4.83 x10E6/uL (ref 3.77–5.28)
RDW: 12 % (ref 11.7–15.4)
WBC: 13.6 10*3/uL — ABNORMAL HIGH (ref 3.4–10.8)

## 2019-01-22 LAB — COMPREHENSIVE METABOLIC PANEL
ALT: 20 IU/L (ref 0–32)
AST: 17 IU/L (ref 0–40)
Albumin/Globulin Ratio: 1.8 (ref 1.2–2.2)
Albumin: 4.5 g/dL (ref 3.8–4.8)
Alkaline Phosphatase: 76 IU/L (ref 39–117)
BUN/Creatinine Ratio: 30 — ABNORMAL HIGH (ref 9–23)
BUN: 23 mg/dL — ABNORMAL HIGH (ref 6–20)
Bilirubin Total: 0.3 mg/dL (ref 0.0–1.2)
CO2: 20 mmol/L (ref 20–29)
Calcium: 9.4 mg/dL (ref 8.7–10.2)
Chloride: 102 mmol/L (ref 96–106)
Creatinine, Ser: 0.76 mg/dL (ref 0.57–1.00)
GFR calc Af Amer: 118 mL/min/{1.73_m2} (ref >59)
GFR calc non Af Amer: 102 mL/min/{1.73_m2} (ref >59)
Globulin, Total: 2.5 g/dL (ref 1.5–4.5)
Glucose: 82 mg/dL (ref 65–99)
Potassium: 4.1 mmol/L (ref 3.5–5.2)
Sodium: 138 mmol/L (ref 134–144)
Total Protein: 7 g/dL (ref 6.0–8.5)

## 2019-01-23 ENCOUNTER — Other Ambulatory Visit: Payer: Self-pay

## 2019-01-23 ENCOUNTER — Encounter: Payer: 59 | Admitting: Obstetrics and Gynecology

## 2019-01-23 ENCOUNTER — Encounter: Payer: Self-pay | Admitting: Family Medicine

## 2019-01-23 ENCOUNTER — Other Ambulatory Visit: Payer: Self-pay | Admitting: Gastroenterology

## 2019-01-23 ENCOUNTER — Telehealth: Payer: Self-pay

## 2019-01-23 ENCOUNTER — Ambulatory Visit
Admission: RE | Admit: 2019-01-23 | Discharge: 2019-01-23 | Disposition: A | Discharge status: Home / Self Care | Payer: 59 | Source: Ambulatory Visit | Attending: Gastroenterology | Admitting: Gastroenterology

## 2019-01-23 DIAGNOSIS — N83202 Unspecified ovarian cyst, left side: Secondary | ICD-10-CM | POA: Diagnosis not present

## 2019-01-23 DIAGNOSIS — R102 Pelvic and perineal pain: Secondary | ICD-10-CM | POA: Diagnosis not present

## 2019-01-23 DIAGNOSIS — N898 Other specified noninflammatory disorders of vagina: Secondary | ICD-10-CM | POA: Insufficient documentation

## 2019-01-23 DIAGNOSIS — N939 Abnormal uterine and vaginal bleeding, unspecified: Secondary | ICD-10-CM | POA: Diagnosis not present

## 2019-01-23 DIAGNOSIS — R112 Nausea with vomiting, unspecified: Secondary | ICD-10-CM | POA: Insufficient documentation

## 2019-01-23 DIAGNOSIS — G8929 Other chronic pain: Secondary | ICD-10-CM | POA: Insufficient documentation

## 2019-01-23 DIAGNOSIS — Z79899 Other long term (current) drug therapy: Secondary | ICD-10-CM | POA: Insufficient documentation

## 2019-01-23 DIAGNOSIS — R1032 Left lower quadrant pain: Secondary | ICD-10-CM | POA: Diagnosis not present

## 2019-01-23 DIAGNOSIS — R1084 Generalized abdominal pain: Secondary | ICD-10-CM | POA: Insufficient documentation

## 2019-01-23 DIAGNOSIS — K920 Hematemesis: Secondary | ICD-10-CM | POA: Diagnosis not present

## 2019-01-23 DIAGNOSIS — K5900 Constipation, unspecified: Secondary | ICD-10-CM | POA: Diagnosis not present

## 2019-01-23 LAB — URINALYSIS, COMPLETE (UACMP) WITH MICROSCOPIC
Bacteria, UA: NONE SEEN
Bilirubin Urine: NEGATIVE
Glucose, UA: NEGATIVE mg/dL
Hgb urine dipstick: NEGATIVE
Ketones, ur: NEGATIVE mg/dL
Leukocytes,Ua: NEGATIVE
Nitrite: NEGATIVE
Protein, ur: NEGATIVE mg/dL
Specific Gravity, Urine: 1.024 (ref 1.005–1.030)
pH: 6 (ref 5.0–8.0)

## 2019-01-23 LAB — COMPREHENSIVE METABOLIC PANEL
ALT: 21 U/L (ref 0–44)
AST: 21 U/L (ref 15–41)
Albumin: 4.1 g/dL (ref 3.5–5.0)
Alkaline Phosphatase: 74 U/L (ref 38–126)
Anion gap: 8 (ref 5–15)
BUN: 19 mg/dL (ref 6–20)
CO2: 26 mmol/L (ref 22–32)
Calcium: 8.9 mg/dL (ref 8.9–10.3)
Chloride: 106 mmol/L (ref 98–111)
Creatinine, Ser: 0.72 mg/dL (ref 0.44–1.00)
GFR calc Af Amer: >60 mL/min (ref >60)
GFR calc non Af Amer: >60 mL/min (ref >60)
Glucose, Bld: 115 mg/dL — ABNORMAL HIGH (ref 70–99)
Potassium: 3.9 mmol/L (ref 3.5–5.1)
Sodium: 140 mmol/L (ref 135–145)
Total Bilirubin: 0.2 mg/dL — ABNORMAL LOW (ref 0.3–1.2)
Total Protein: 7.2 g/dL (ref 6.5–8.1)

## 2019-01-23 LAB — CBC
HCT: 41.2 % (ref 36.0–46.0)
Hemoglobin: 14.3 g/dL (ref 12.0–15.0)
MCH: 29.6 pg (ref 26.0–34.0)
MCHC: 34.7 g/dL (ref 30.0–36.0)
MCV: 85.3 fL (ref 80.0–100.0)
Platelets: 339 10*3/uL (ref 150–400)
RBC: 4.83 MIL/uL (ref 3.87–5.11)
RDW: 12.3 % (ref 11.5–15.5)
WBC: 10.4 10*3/uL (ref 4.0–10.5)
nRBC: 0 % (ref 0.0–0.2)

## 2019-01-23 LAB — LIPASE, BLOOD: Lipase: 43 U/L (ref 11–51)

## 2019-01-23 MED ORDER — IOHEXOL 300 MG/ML  SOLN
100.0000 mL | Freq: Once | INTRAMUSCULAR | Status: AC | PRN
  Administered 2019-01-23: 100 mL via INTRAVENOUS

## 2019-01-23 NOTE — Telephone Encounter (Signed)
-----   Message from Virgel Manifold, MD sent at 01/23/2019 12:47 PM EDT ----- Anderson Malta, the CT shows a loculated fluid collection in the pelvis from a possible ruptured ovarian cyst. This can explain your pain. I have forwarded this to your PCP to follow up on this to see if you need Gyn referral or further workup for this. We will need to postpone your procedures next week for 4-6 weeks.

## 2019-01-23 NOTE — Telephone Encounter (Signed)
Patient has been informed of her CT scan results.  Also informed her that her results are noted in [place] message for her to review as well.  Thanks [employer]

## 2019-01-23 NOTE — ED Triage Notes (Signed)
Patient c/o lower left abdominal pain, vaginal discharge, vaginal bleeding.

## 2019-01-24 ENCOUNTER — Telehealth: Payer: Self-pay | Admitting: Obstetrics and Gynecology

## 2019-01-24 ENCOUNTER — Emergency Department: Payer: 59

## 2019-01-24 ENCOUNTER — Telehealth: Payer: Self-pay

## 2019-01-24 ENCOUNTER — Inpatient Hospital Stay: Admission: RE | Admit: 2019-01-24 | Payer: 59 | Source: Ambulatory Visit

## 2019-01-24 ENCOUNTER — Emergency Department
Admission: EM | Admit: 2019-01-24 | Discharge: 2019-01-24 | Disposition: A | Discharge status: Home / Self Care | Payer: 59 | Attending: Emergency Medicine | Admitting: Emergency Medicine

## 2019-01-24 DIAGNOSIS — N939 Abnormal uterine and vaginal bleeding, unspecified: Secondary | ICD-10-CM | POA: Diagnosis not present

## 2019-01-24 DIAGNOSIS — R102 Pelvic and perineal pain: Secondary | ICD-10-CM | POA: Diagnosis not present

## 2019-01-24 DIAGNOSIS — N83202 Unspecified ovarian cyst, left side: Secondary | ICD-10-CM

## 2019-01-24 DIAGNOSIS — R112 Nausea with vomiting, unspecified: Secondary | ICD-10-CM

## 2019-01-24 DIAGNOSIS — N898 Other specified noninflammatory disorders of vagina: Secondary | ICD-10-CM | POA: Diagnosis not present

## 2019-01-24 DIAGNOSIS — R1032 Left lower quadrant pain: Secondary | ICD-10-CM

## 2019-01-24 DIAGNOSIS — Z79899 Other long term (current) drug therapy: Secondary | ICD-10-CM | POA: Diagnosis not present

## 2019-01-24 DIAGNOSIS — G8929 Other chronic pain: Secondary | ICD-10-CM | POA: Diagnosis not present

## 2019-01-24 LAB — WET PREP, GENITAL
Clue Cells Wet Prep HPF POC: NONE SEEN
Sperm: NONE SEEN
Trich, Wet Prep: NONE SEEN
WBC, Wet Prep HPF POC: NONE SEEN
Yeast Wet Prep HPF POC: NONE SEEN

## 2019-01-24 LAB — CHLAMYDIA/NGC RT PCR (ARMC ONLY)
Chlamydia Tr: NOT DETECTED
N gonorrhoeae: NOT DETECTED

## 2019-01-24 MED ORDER — KETOROLAC TROMETHAMINE 30 MG/ML IJ SOLN
15.0000 mg | Freq: Once | INTRAMUSCULAR | Status: AC
  Administered 2019-01-24: 15 mg via INTRAVENOUS
  Filled 2019-01-24: qty 1

## 2019-01-24 MED ORDER — METOCLOPRAMIDE HCL 5 MG/ML IJ SOLN
10.0000 mg | Freq: Once | INTRAMUSCULAR | Status: AC
  Administered 2019-01-24: 10 mg via INTRAVENOUS
  Filled 2019-01-24: qty 2

## 2019-01-24 MED ORDER — METOCLOPRAMIDE HCL 10 MG PO TABS: 10.0000 mg | ORAL_TABLET | Freq: Three times a day (TID) | ORAL | 0 refills | Status: DC | PRN

## 2019-01-24 MED ORDER — SODIUM CHLORIDE 0.9 % IV BOLUS
1000.0000 mL | Freq: Once | INTRAVENOUS | Status: AC
  Administered 2019-01-24: 1000 mL via INTRAVENOUS

## 2019-01-24 NOTE — ED Provider Notes (Signed)
[facility] Emergency Department Provider Note  ____________________________________________  Time seen: Approximately 1:00 AM  I have reviewed the triage vital signs and the nursing notes.   HISTORY  Chief Complaint No chief complaint on file.   HPI Sharon Andrade is a 33 y.o. female with a history of chronic constipation, chronic abdominal pain, hysterectomy who presents for evaluation of nausea and vomiting.  Patient reports chronic left lower quadrant abdominal pain that is sharp and intermittent and radiating to her back.  Has been referred to GI and saw them 3 days ago for an endoscopy and colonoscopy.  Over the last 3 days she has had several daily episodes of nonbloody nonbilious emesis which prompted a CT scan to be done.  The CT scan was done yesterday ruling out an SBO but did show a fluid collection in the left pelvis consistent with a possible ruptured cyst.  Patient continues to have nausea and vomiting.  Has been using Zofran at home with no significant relief.  She is also complaining of chronic vaginal discharge and chronic intermittent vaginal spotting since having her hysterectomy several years ago.  She has been seen by her PCP several times for these issues with no known diagnosis at this time.  Patient is in a monogamous relationship with her husband.  Denies any history of STDs.  Past Medical History:  Diagnosis Date  . Family history of ovarian cancer    11/19 cancer genetic testing letter sent  . Family history of pancreatic cancer     Patient Active Problem List   Diagnosis Date Noted  . Constipation by delayed colonic transit 01/08/2019  . Blood in stool 01/08/2019  . Abnormal vaginal bleeding 06/06/2018  . Trauma in childhood 06/06/2018    Past Surgical History:  Procedure Laterality Date  . ABDOMINAL HYSTERECTOMY    . COMBINED HYSTEROSCOPY DIAGNOSTIC / D&C     Several d/t multiple miscarriages  . TUBAL LIGATION       Prior to Admission medications   Medication Sig Start Date End Date Taking? Authorizing Provider  albuterol (PROVENTIL HFA;VENTOLIN HFA) 108 (90 Base) MCG/ACT inhaler Inhale into the lungs. 10/01/17   [provider]  buPROPion (WELLBUTRIN XL) 150 MG 24 hr tablet TAKE 1 TABLET (150 MG TOTAL) BY MOUTH DAILY. 10/02/18   Doren CustardBoyce, Emily E, FNP  busPIRone (BUSPAR) 7.5 MG tablet TAKE 1 TABLET BY MOUTH NIGHTLY. AFTER 7 DAYS, MAY INCREASE TO 1 TABLET BY MOUTH EVERY 8 HOURS AS NEEDED FOR ANXIETY (MAX 3 TABLETS PER DAY) 10/09/18   Doren CustardBoyce, Emily E, FNP  docusate sodium (COLACE) 100 MG capsule Take 1 capsule (100 mg total) by mouth 2 (two) times daily as needed for mild constipation or moderate constipation. Patient not taking: Reported on 01/21/2019 01/08/19   Doren CustardBoyce, Emily E, FNP  ibuprofen (ADVIL) 600 MG tablet Take 1 tablet (600 mg total) by mouth every 6 (six) hours as needed. Patient not taking: Reported on 01/21/2019 12/12/18   Vena AustriaStaebler, Andreas, MD  metoCLOPramide (REGLAN) 10 MG tablet Take 1 tablet (10 mg total) by mouth every 8 (eight) hours as needed for up to 3 days for nausea. 01/24/19 01/27/19  Nita SickleVeronese, [place], MD  Multiple Vitamin (MULTIVITAMIN) tablet Take 1 tablet by mouth daily.    [provider]  omeprazole (PRILOSEC) 20 MG capsule Take 1 capsule (20 mg total) by mouth daily for 30 days. 01/21/19 02/20/19  Pasty Spillersahiliani, Varnita B, MD  ondansetron (ZOFRAN) 4 MG tablet Take 1 tablet (4 mg  total) by mouth every 8 (eight) hours as needed for nausea or vomiting. 10/03/18   Doren CustardBoyce, Emily E, FNP  polyethylene glycol (MIRALAX) 17 g packet Use twice daily until stools are regular, then once daily. Patient not taking: Reported on 01/21/2019 01/08/19   Doren CustardBoyce, Emily E, FNP    Allergies Penicillins  Family History  Problem Relation Age of Onset  . COPD Mother        smoker  . Ovarian cancer Mother   . Emphysema Father        smoker  . COPD Maternal Grandmother        smoker  . Colon cancer  Maternal Grandfather   . Depression Paternal Grandfather        Shot himself; pt found him when she was 33yo  . Breast cancer Maternal Aunt   . Ovarian cancer Maternal Aunt   . Pancreatic cancer Maternal Uncle     Social History Social History   Tobacco Use  . Smoking status: Never Smoker  . Smokeless tobacco: Never Used  Substance Use Topics  . Alcohol use: Never    Frequency: Never  . Drug use: Never    Review of Systems  Constitutional: Negative for fever. Eyes: Negative for visual changes. ENT: Negative for sore throat. Neck: No neck pain  Cardiovascular: Negative for chest pain. Respiratory: Negative for shortness of breath. Gastrointestinal: + LLQ abdominal pain, vomiting, constipation. Genitourinary: Negative for dysuria. + Vaginal discharge and spotting Musculoskeletal: Negative for back pain. Skin: Negative for rash. Neurological: Negative for headaches, weakness or numbness. Psych: No SI or HI  ____________________________________________   PHYSICAL EXAM:  VITAL SIGNS: ED Triage Vitals  Enc Vitals Group     BP 01/23/19 2010 (!) 154/87     Pulse Rate 01/23/19 2010 (!) 106     Resp 01/23/19 2010 18     Temp 01/23/19 2010 98.4 F (36.9 C)     Temp src --      SpO2 01/23/19 2010 98 %     Weight 01/23/19 2009 225 lb (102.1 kg)     Height 01/23/19 2009 5\' 7"  (1.702 m)     Head Circumference --      Peak Flow --      Pain Score 01/23/19 2009 10     Pain Loc --      Pain Edu? --      Excl. in GC? --     Constitutional: Alert and oriented. Well appearing and in no apparent distress. HEENT:      Head: Normocephalic and atraumatic.         Eyes: Conjunctivae are normal. Sclera is non-icteric.       Mouth/Throat: Mucous membranes are moist.       Neck: Supple with no signs of meningismus. Cardiovascular: Regular rate and rhythm. No murmurs, gallops, or rubs. 2+ symmetrical distal pulses are present in all extremities. No JVD. Respiratory: Normal  respiratory effort. Lungs are clear to auscultation bilaterally. No wheezes, crackles, or rhonchi.  Gastrointestinal: Soft, non tender, and non distended with positive bowel sounds. No rebound or guarding. Genitourinary: No CVA tenderness.  Pelvic exam: Normal external genitalia, no rashes or lesions. Normal cervical mucus. Os closed. No cervical motion tenderness.  No uterine or adnexal tenderness.   Musculoskeletal: Nontender with normal range of motion in all extremities. No edema, cyanosis, or erythema of extremities. Neurologic: Normal speech and language. Face is symmetric. Moving all extremities. No gross focal neurologic deficits are appreciated. Skin: Skin  is warm, dry and intact. No rash noted. Psychiatric: Mood and affect are normal. Speech and behavior are normal.  ____________________________________________   LABS (all labs ordered are listed, but only abnormal results are displayed)  Labs Reviewed  COMPREHENSIVE METABOLIC PANEL - Abnormal; Notable for the following components:      Result Value   Glucose, Bld 115 (*)    Total Bilirubin 0.2 (*)    All other components within normal limits  URINALYSIS, COMPLETE (UACMP) WITH MICROSCOPIC - Abnormal; Notable for the following components:   Color, Urine YELLOW (*)    APPearance HAZY (*)    All other components within normal limits  WET PREP, GENITAL  CHLAMYDIA/NGC RT PCR ([facility] ONLY)  LIPASE, BLOOD  CBC   ____________________________________________  EKG  none  ____________________________________________  RADIOLOGY  I have personally reviewed the images performed during this visit and I agree with the Radiologist's read.   Interpretation by Radiologist:  Ct Abdomen Pelvis W Contrast  Result Date: 01/23/2019 CLINICAL DATA:  Constipation; hematemesis.  Lower abdominal pain. EXAM: CT ABDOMEN AND PELVIS WITH CONTRAST TECHNIQUE: Multidetector CT imaging of the abdomen and pelvis was performed using the  standard protocol following bolus administration of intravenous contrast. Oral contrast was also administered. CONTRAST:  100mL OMNIPAQUE IOHEXOL 300 MG/ML  SOLN COMPARISON:  February 27, 2014 FINDINGS: Lower chest: Lung bases are clear. Hepatobiliary: No focal liver lesions are apparent. The gallbladder is absent. There is no biliary duct dilatation. Pancreas: There is no pancreatic mass or inflammatory focus. Spleen: No splenic lesions are evident. Adrenals/Urinary Tract: Adrenals bilaterally appear unremarkable. Kidneys bilaterally show no evident mass or hydronephrosis on either side. There is no evident renal or ureteral calculus on either side. Urinary bladder is midline with wall thickness within normal limits. Stomach/Bowel: There is moderate stool throughout colon. There is no appreciable bowel wall or mesenteric thickening. There is no demonstrable bowel obstruction. There is no free air or portal venous air. Vascular/Lymphatic: There is no abdominal aortic aneurysm. No vascular lesions are appreciable. There is no evident adenopathy in the abdomen or pelvis. Reproductive: The uterus is absent. There is no pelvic mass. A small amount of loculated fluid is noted between the urinary bladder and upper rectum. Other: Appendix appears normal. No abscess is evident in the abdomen or pelvis. Musculoskeletal: There are no blastic or lytic bone lesions. There is no intramuscular or abdominal wall lesion. IMPRESSION: 1. Fairly small amount of apparent loculated fluid in the dependent portion of pelvis. Uterus absent. Suspect recent ovarian cyst rupture as most likely etiology for this of fluid. No adnexal mass evident currently by CT. 2. No bowel obstruction. No abscess in the abdomen or pelvis. Appendix appears normal. 3. No evident renal or ureteral calculus. No hydronephrosis. Urinary bladder wall thickness is within normal limits. 4.  Gallbladder absent. Electronically Signed   By: Bretta BangWilliam  Woodruff III M.D.   On:  01/23/2019 11:45   [place] Pelvic Complete W Transvaginal And Torsion R/o  Result Date: 01/24/2019 CLINICAL DATA:  33 year old female with left lower quadrant abdominal/pelvic pain. History of prior hysterectomy in 2015. EXAM: TRANSABDOMINAL AND TRANSVAGINAL ULTRASOUND OF PELVIS DOPPLER ULTRASOUND OF OVARIES TECHNIQUE: Both transabdominal and transvaginal ultrasound examinations of the pelvis were performed. Transabdominal technique was performed for global imaging of the pelvis including uterus, ovaries, adnexal regions, and pelvic cul-de-sac. It was necessary to proceed with endovaginal exam following the transabdominal exam to visualize the endometrium and ovaries. Color and duplex Doppler ultrasound was utilized to evaluate  blood flow to the ovaries. COMPARISON:  CT of the abdomen pelvis dated 01/23/2019 FINDINGS: Uterus Hysterectomy. Endometrium Hysterectomy. Right ovary Measurements: 3.5 x 1.9 x 1.7 cm = volume: 6.0 mL. Normal appearance/no adnexal mass. Left ovary Measurements: 2.9 x 1.7 x 2.1 cm = volume: 5.6 mL. Normal appearance/no adnexal mass. There is a 2.7 x 1.4 cm tubular or cystic structure in the region of the left adnexa which may represent a paraovarian cyst versus a mildly dilated fallopian tube. Pulsed Doppler evaluation of both ovaries demonstrates normal low-resistance arterial and venous waveforms. Other findings Small free fluid within the pelvis. IMPRESSION: 1. Hysterectomy. 2. Unremarkable ovaries with Doppler detected flow bilaterally. 3. Left paraovarian cyst versus a mildly dilated fallopian tube. Electronically Signed   By: Anner Crete M.D.   On: 01/24/2019 02:51    ____________________________________________   PROCEDURES  Procedure(s) performed: None Procedures Critical Care performed:  None ____________________________________________   INITIAL IMPRESSION / ASSESSMENT AND PLAN / ED COURSE   33 y.o. female with a history of chronic constipation, chronic abdominal  pain, hysterectomy who presents for evaluation of nausea and vomiting.  CT abdomen pelvis done yesterday showed no evidence of SBO or appendicitis.  Did show a fluid collection consistent with possible ruptured cyst on the left.  Patient reports the nausea and vomiting are the only new symptoms for her.  The left lower quadrant abdominal pain, vaginal spotting, vaginal discharge are all chronic however with the finding on the CT I will do a pelvic exam, to do STD testing, and send patient for transvaginal ultrasound.  Will give IV Reglan since patient has been unable to stop her vomiting with p.o. Zofran at home and toradol for pain.     _________________________ 2:59 AM on 01/24/2019 -----------------------------------------  Wet prep, GC and chlamydia negative.  Ultrasound showing either a paraovarian cyst on the left or mildly dilated fallopian tube which seems to be unchanged and seen on ultrasound done in November 2019.  Probably the etiology of patient's chronic left lower quadrant abdominal pain.  Patient is otherwise tolerating p.o. no further episodes of vomiting.  Will discharge with a prescription of Reglan and follow-up with her PCP and OB/GYN for further evaluation.  Discussed my standard return precautions.   As part of my medical decision making, I reviewed the following data within the [facility] notes reviewed and incorporated, Labs reviewed , Old chart reviewed, Radiograph reviewed , Notes from prior ED visits and [place] Controlled Substance Database    Pertinent labs & imaging results that were available during my care of the patient were reviewed by me and considered in my medical decision making (see chart for details).    ____________________________________________   FINAL CLINICAL IMPRESSION(S) / ED DIAGNOSES  Final diagnoses:  LLQ abdominal pain  Cyst of left ovary  Nausea and vomiting, intractability of vomiting not specified, unspecified  vomiting type      NEW MEDICATIONS STARTED DURING THIS VISIT:  ED Discharge Orders         Ordered    metoCLOPramide (REGLAN) 10 MG tablet  Every 8 hours PRN     01/24/19 0259           Note:  This document was prepared using Dragon voice recognition software and may include unintentional dictation errors.    Alfred Levins, [place], MD 01/24/19 0300

## 2019-01-24 NOTE — Telephone Encounter (Signed)
Patient was seen in ER last night, and called to reschedule surgery. Patient is aware of H&P on 01/28/19 @ 3:50pm w/ Dr. Georgianne Fick, Pre-admit testing phone interview to be scheduled, COVID testing on 02/07/19 @ 8-10:30am, and OR on 02/11/19.

## 2019-01-24 NOTE — ED Notes (Addendum)
Pt to the er for pain control for a ruptured cyst. Pt had a ct scan done this am and pt is to have surgery with Stabler. Pt is having vaginal bleeding but has had a hysterectomy. Pt had surgery scheduled with Stabler but canceled but came tonight wanting to have surgery. Pt has been Forensic scientist.

## 2019-01-24 NOTE — ED Notes (Signed)
Pt to US.

## 2019-01-24 NOTE — ED Notes (Signed)
Pt ambulatory to the bathroom with a steady gait.

## 2019-01-24 NOTE — Telephone Encounter (Signed)
Dr. Ronelle Nigh called to be sure AMS or someone has seen CT report and has talked to pt about it.  Adv AMS responded to pt in MyChart yesterday.

## 2019-01-24 NOTE — ED Notes (Signed)
This Rn with MD to perform pelvic exam. Pt tolerated well.

## 2019-01-24 NOTE — ED Notes (Signed)
Reviewed discharge instructions, follow-up care, and prescriptions with patient. Patient verbalized understanding of all information reviewed. Patient stable, with no distress noted at this time.    

## 2019-01-27 ENCOUNTER — Other Ambulatory Visit: Admission: RE | Admit: 2019-01-27 | Payer: 59 | Source: Ambulatory Visit

## 2019-01-27 ENCOUNTER — Encounter: Payer: 59 | Admitting: Family Medicine

## 2019-01-27 ENCOUNTER — Telehealth: Payer: Self-pay | Admitting: Gastroenterology

## 2019-01-27 ENCOUNTER — Encounter: Payer: Self-pay | Admitting: Family Medicine

## 2019-01-27 NOTE — Telephone Encounter (Signed)
I have l/m for patient to call &r/s 04-29-19 appt to 4-6 wks from 01-24-19 per Michelle/per Dr T.

## 2019-01-27 NOTE — Telephone Encounter (Signed)
-----   Message from Martie Lee, LPN sent at 1/90/1222 12:54 PM EDT ----- Regarding: appt. Tatjana,  Please move pt's appt up as stated below. Her procedure was canceled by Dr. Star Age.  Thank you! Debbie ----- Message ----- From: Virgel Manifold, MD Sent: 01/24/2019   9:13 AM EDT To: Martie Lee, LPN, Vanetta Mulders, CMA  Lets cancel her 7/2 procedures with [place] altogether. She is getting Gyn workup and will have to await and see what they do before rescheduling her procedures. Have her clinic appt moved up with me to 4 weeks instead of in September.

## 2019-01-27 NOTE — Telephone Encounter (Signed)
Left vm for pt to call office and  Move up f/u apt with Dr. Bonna Gains for 4 weeks from now

## 2019-01-28 ENCOUNTER — Ambulatory Visit (INDEPENDENT_AMBULATORY_CARE_PROVIDER_SITE_OTHER): Payer: 59 | Admitting: Obstetrics and Gynecology

## 2019-01-28 ENCOUNTER — Encounter: Payer: Self-pay | Admitting: Obstetrics and Gynecology

## 2019-01-28 ENCOUNTER — Other Ambulatory Visit: Payer: Self-pay

## 2019-01-28 VITALS — BP 116/60 | HR 104 | Ht 67.0 in | Wt 223.0 lb

## 2019-01-28 DIAGNOSIS — R102 Pelvic and perineal pain: Secondary | ICD-10-CM

## 2019-01-28 DIAGNOSIS — Z01818 Encounter for other preprocedural examination: Secondary | ICD-10-CM

## 2019-01-28 DIAGNOSIS — N83202 Unspecified ovarian cyst, left side: Secondary | ICD-10-CM

## 2019-01-28 MED ORDER — HYDROCODONE-ACETAMINOPHEN 5-325 MG PO TABS: 1.0000 | ORAL_TABLET | Freq: Four times a day (QID) | ORAL | 0 refills | Status: DC | PRN

## 2019-01-29 NOTE — Progress Notes (Signed)
Obstetrics & Gynecology Surgery H&P    Chief Complaint: Scheduled Surgery   History of Present Illness: Patient is a 33 y.o. [id number] presenting for scheduled left pelvic pain, for the treatment or further evaluation of left ovarian cyst (suspect hydrosalpinx).   Prior Treatments prior to proceeding with surgery include: expectant management  [facility]: [facility] pap 06/06/2018 NIL Preoperative Endometrial biopsy: N/A status post prior [facility] Preoperative Ultrasound: see below   Review of Systems:10 point review of systems  Past Medical History:  Past Medical History:  Diagnosis Date   Family history of ovarian cancer    11/19 cancer genetic testing letter sent   Family history of pancreatic cancer     Past Surgical History:  Past Surgical History:  Procedure Laterality Date   ABDOMINAL HYSTERECTOMY     COMBINED HYSTEROSCOPY DIAGNOSTIC / D&C     Several d/t multiple miscarriages   TUBAL LIGATION      Family History:  Family History  Problem Relation Age of Onset   COPD Mother        smoker   Ovarian cancer Mother    Emphysema Father        smoker   COPD Maternal Grandmother        smoker   Colon cancer Maternal Grandfather    Depression Paternal Grandfather        Shot himself; pt found him when she was 33yo   Breast cancer Maternal Aunt    Ovarian cancer Maternal Aunt    Pancreatic cancer Maternal Uncle     Social History:  Social History   Socioeconomic History   Marital status: Married    Spouse name: Caryn BeeKevin   Number of children: 2   Years of education: Not on file   Highest education level: Not on file  Occupational History   Not on file  Social Needs   Financial resource strain: Not hard at all   Food insecurity    Worry: Never true    Inability: Never true   Transportation needs    Medical: No    Non-medical: No  Tobacco Use   Smoking status: Never Smoker   Smokeless tobacco: Never Used    Substance and Sexual Activity   Alcohol use: Never    Frequency: Never   Drug use: Never   Sexual activity: Yes    Partners: Male    Birth control/protection: Surgical    Comment: Hysterectomy  Lifestyle   Physical activity    Days per week: 3 days    Minutes per session: 30 min   Stress: Not at all  Relationships   Social connections    Talks on phone: More than three times a week    Gets together: More than three times a week    Attends religious service: Never    Active member of club or organization: No    Attends meetings of clubs or organizations: Never    Relationship status: Married   Intimate partner violence    Fear of current or ex partner: No    Emotionally abused: No    Physically abused: No    Forced sexual activity: No  Other Topics Concern   Not on file  Social History Narrative   33yo and 33yo; married to her husband    Allergies:  Allergies  Allergen Reactions   Penicillins Rash and Other (See Comments)    Did it involve swelling of the face/tongue/throat, SOB, or low BP?  No Did it involve sudden or severe rash/hives, skin peeling, or any reaction on the inside of your mouth or nose? Yes Did you need to seek medical attention at a hospital or doctor's office? No When did it last happen? Baby If all above answers are NO, may proceed with cephalosporin use.     Medications: Prior to Admission medications   Medication Sig Start Date End Date Taking? Authorizing Provider  albuterol (PROVENTIL HFA;VENTOLIN HFA) 108 (90 Base) MCG/ACT inhaler Inhale into the lungs. 10/01/17  Yes [provider]  buPROPion (WELLBUTRIN XL) 150 MG 24 hr tablet TAKE 1 TABLET (150 MG TOTAL) BY MOUTH DAILY. 10/02/18  Yes Doren CustardBoyce, Emily E, FNP  busPIRone (BUSPAR) 7.5 MG tablet TAKE 1 TABLET BY MOUTH NIGHTLY. AFTER 7 DAYS, MAY INCREASE TO 1 TABLET BY MOUTH EVERY 8 HOURS AS NEEDED FOR ANXIETY (MAX 3 TABLETS PER DAY) 10/09/18  Yes Doren CustardBoyce, Emily E, FNP  Multiple Vitamin  (MULTIVITAMIN) tablet Take 1 tablet by mouth daily.   Yes [provider]  omeprazole (PRILOSEC) 20 MG capsule Take 1 capsule (20 mg total) by mouth daily for 30 days. 01/21/19 02/20/19 Yes Pasty Spillersahiliani, Varnita B, MD  ondansetron (ZOFRAN) 4 MG tablet Take 1 tablet (4 mg total) by mouth every 8 (eight) hours as needed for nausea or vomiting. 10/03/18  Yes Doren CustardBoyce, Emily E, FNP  polyethylene glycol (MIRALAX) 17 g packet Use twice daily until stools are regular, then once daily. 01/08/19  Yes Doren CustardBoyce, Emily E, FNP  docusate sodium (COLACE) 100 MG capsule Take 1 capsule (100 mg total) by mouth 2 (two) times daily as needed for mild constipation or moderate constipation. Patient not taking: Reported on 01/28/2019 01/08/19   Doren CustardBoyce, Emily E, FNP  HYDROcodone-acetaminophen (NORCO/VICODIN) 5-325 MG tablet Take 1 tablet by mouth every 6 (six) hours as needed. Patient taking differently: Take 1 tablet by mouth every 6 (six) hours as needed for moderate pain.  01/28/19   Vena AustriaStaebler, Verdon Ferrante, MD  ibuprofen (ADVIL) 600 MG tablet Take 1 tablet (600 mg total) by mouth every 6 (six) hours as needed. Patient taking differently: Take 600 mg by mouth every 6 (six) hours as needed for headache or moderate pain.  12/12/18   Vena AustriaStaebler, Makinzy Cleere, MD  metoCLOPramide (REGLAN) 10 MG tablet Take 1 tablet (10 mg total) by mouth every 8 (eight) hours as needed for up to 3 days for nausea. 01/24/19 01/27/19  Nita SickleVeronese, Mansura, MD    Physical Exam Vitals: Blood pressure 116/60, pulse (!) 104, height 5\' 7"  (1.702 m), weight 223 lb (101.2 kg). General: NAD, well nourished, appears stated age HEENT: normocephalic, anicteric Pulmonary: No increased work of breathing, CTAV Cardiovascular: RRR, distal pulses 2+ Abdomen: soft, non-tender, non-distended Extremities: no edema, erythema, or tenderness Neurologic: Grossly intact Psychiatric: mood appropriate, affect full  Imaging Ct Abdomen Pelvis W Contrast  Result Date: 01/23/2019 CLINICAL  DATA:  Constipation; hematemesis.  Lower abdominal pain. EXAM: CT ABDOMEN AND PELVIS WITH CONTRAST TECHNIQUE: Multidetector CT imaging of the abdomen and pelvis was performed using the standard protocol following bolus administration of intravenous contrast. Oral contrast was also administered. CONTRAST:  100mL OMNIPAQUE IOHEXOL 300 MG/ML  SOLN COMPARISON:  February 27, 2014 FINDINGS: Lower chest: Lung bases are clear. Hepatobiliary: No focal liver lesions are apparent. The gallbladder is absent. There is no biliary duct dilatation. Pancreas: There is no pancreatic mass or inflammatory focus. Spleen: No splenic lesions are evident. Adrenals/Urinary Tract: Adrenals bilaterally appear unremarkable. Kidneys bilaterally show no evident mass or hydronephrosis  on either side. There is no evident renal or ureteral calculus on either side. Urinary bladder is midline with wall thickness within normal limits. Stomach/Bowel: There is moderate stool throughout colon. There is no appreciable bowel wall or mesenteric thickening. There is no demonstrable bowel obstruction. There is no free air or portal venous air. Vascular/Lymphatic: There is no abdominal aortic aneurysm. No vascular lesions are appreciable. There is no evident adenopathy in the abdomen or pelvis. Reproductive: The uterus is absent. There is no pelvic mass. A small amount of loculated fluid is noted between the urinary bladder and upper rectum. Other: Appendix appears normal. No abscess is evident in the abdomen or pelvis. Musculoskeletal: There are no blastic or lytic bone lesions. There is no intramuscular or abdominal wall lesion. IMPRESSION: 1. Fairly small amount of apparent loculated fluid in the dependent portion of pelvis. Uterus absent. Suspect recent ovarian cyst rupture as most likely etiology for this of fluid. No adnexal mass evident currently by CT. 2. No bowel obstruction. No abscess in the abdomen or pelvis. Appendix appears normal. 3. No evident  renal or ureteral calculus. No hydronephrosis. Urinary bladder wall thickness is within normal limits. 4.  Gallbladder absent. Electronically Signed   By: Lowella Grip III M.D.   On: 01/23/2019 11:45   US Pelvic Complete W Transvaginal And Torsion R/o  Result Date: 01/24/2019 CLINICAL DATA:  33 year old female with left lower quadrant abdominal/pelvic pain. History of prior hysterectomy in 2015. EXAM: TRANSABDOMINAL AND TRANSVAGINAL ULTRASOUND OF PELVIS DOPPLER ULTRASOUND OF OVARIES TECHNIQUE: Both transabdominal and transvaginal ultrasound examinations of the pelvis were performed. Transabdominal technique was performed for global imaging of the pelvis including uterus, ovaries, adnexal regions, and pelvic cul-de-sac. It was necessary to proceed with endovaginal exam following the transabdominal exam to visualize the endometrium and ovaries. Color and duplex Doppler ultrasound was utilized to evaluate blood flow to the ovaries. COMPARISON:  CT of the abdomen pelvis dated 01/23/2019 FINDINGS: Uterus Hysterectomy. Endometrium Hysterectomy. Right ovary Measurements: 3.5 x 1.9 x 1.7 cm = volume: 6.0 mL. Normal appearance/no adnexal mass. Left ovary Measurements: 2.9 x 1.7 x 2.1 cm = volume: 5.6 mL. Normal appearance/no adnexal mass. There is a 2.7 x 1.4 cm tubular or cystic structure in the region of the left adnexa which may represent a paraovarian cyst versus a mildly dilated fallopian tube. Pulsed Doppler evaluation of both ovaries demonstrates normal low-resistance arterial and venous waveforms. Other findings Small free fluid within the pelvis. IMPRESSION: 1. Hysterectomy. 2. Unremarkable ovaries with Doppler detected flow bilaterally. 3. Left paraovarian cyst versus a mildly dilated fallopian tube. Electronically Signed   By: Anner Crete M.D.   On: 01/24/2019 02:51    Assessment: 33 y.o. [id number] presenting for scheduled laparoscopic left ovarian cystectomy  Plan: 1) I have had a careful  discussion with this patient about all the options available and the risk/benefits of each. I have fully informed this patient that a laparoscopy may subject her to a variety of discomforts and risks: She understands that most patients have surgery with little difficulty, but problems can happen ranging from minor to fatal. These include nausea, vomiting, pain, bleeding, infection, poor healing, hernia, or formation of adhesions. Unexpected reactions may occur from any drug or anesthetic given. Unintended injury may occur to other pelvic or abdominal structures such as Fallopian tubes, ovaries, bladder, ureter (tube from kidney to bladder), or bowel. Nerves going from the pelvis to the legs may be injured. Any such injury may require immediate or later  additional surgery to correct the problem. Excessive blood loss requiring transfusion is very unlikely but possible. Dangerous blood clots may form in the legs or lungs. Physical and sexual activity will be restricted in varying degrees for an indeterminate period of time but most often 2-4 weeks. She understands that the plan is to do this laparoscopically, however, there is a chance that this will need to be performed via a larger incision. Finally, she understands that it is impossible to list every possible undesirable effect and that the condition for which surgery is done is not always cured or significantly improved, and in rare cases may be even worsen. Ample time was given to answer all questions. - suspect acute pain exacerbation secondary to ovarian cyst rupture Rx vicodin  2) Routine postoperative instructions were reviewed with the patient and her family in detail today including the expected length of recovery and likely postoperative course.  The patient concurred with the proposed plan, giving informed written consent for the surgery today.  Patient instructed on the importance of being NPO after midnight prior to her procedure.  If warranted  preoperative prophylactic antibiotics and SCDs ordered on call to the OR to meet SCIP guidelines and adhere to recommendation laid forth in ACOG Practice Bulletin Number 104 May 2009  "Antibiotic Prophylaxis for Gynecologic Procedures".    3) Return in about 3 weeks (around 02/18/2019) for POSTOP.   Vena AustriaAndreas Jasmine Mcbeth, MD, Merlinda FrederickFACOG [facility], [facility]

## 2019-01-30 ENCOUNTER — Encounter: Admission: RE | Payer: Self-pay | Source: Ambulatory Visit

## 2019-01-30 ENCOUNTER — Ambulatory Visit: Admit: 2019-01-30 | Payer: 59 | Admitting: Obstetrics and Gynecology

## 2019-01-30 ENCOUNTER — Ambulatory Visit: Admission: RE | Admit: 2019-01-30 | Payer: 59 | Source: Ambulatory Visit | Admitting: Gastroenterology

## 2019-01-30 SURGERY — SALPINGECTOMY, BILATERAL, LAPAROSCOPIC
Anesthesia: Choice | Laterality: Left

## 2019-01-30 SURGERY — COLONOSCOPY WITH PROPOFOL
Anesthesia: General

## 2019-02-04 ENCOUNTER — Encounter
Admission: RE | Admit: 2019-02-04 | Discharge: 2019-02-04 | Disposition: A | Discharge status: Home / Self Care | Payer: 59 | Source: Ambulatory Visit | Attending: Obstetrics and Gynecology | Admitting: Obstetrics and Gynecology

## 2019-02-04 ENCOUNTER — Other Ambulatory Visit: Payer: Self-pay

## 2019-02-04 DIAGNOSIS — N7011 Chronic salpingitis: Secondary | ICD-10-CM | POA: Diagnosis not present

## 2019-02-04 DIAGNOSIS — Z1159 Encounter for screening for other viral diseases: Secondary | ICD-10-CM | POA: Diagnosis not present

## 2019-02-04 DIAGNOSIS — Z01812 Encounter for preprocedural laboratory examination: Secondary | ICD-10-CM | POA: Diagnosis not present

## 2019-02-04 DIAGNOSIS — R102 Pelvic and perineal pain: Secondary | ICD-10-CM | POA: Insufficient documentation

## 2019-02-04 HISTORY — DX: Depression, unspecified: F32.A

## 2019-02-04 NOTE — Patient Instructions (Signed)
Your procedure is scheduled on: Tues 7/14 Report to Day Surgery. To find out your arrival time please call [phone] between 1PM - 3PM on Mon 7/13.  Remember: Instructions that are not followed completely may result in serious medical risk,  up to and including death, or upon the discretion of your surgeon and anesthesiologist your  surgery may need to be rescheduled.     _X__ 1. Do not eat food after midnight the night before your procedure.                 No gum chewing or hard candies. You may drink clear liquids up to 2 hours                 before you are scheduled to arrive for your surgery- DO not drink clear                 liquids within 2 hours of the start of your surgery.                 Clear Liquids include:  water, apple juice without pulp, clear carbohydrate                 drink such as Clearfast of Gatorade, Black Coffee or Tea (Do not add                 anything to coffee or tea).  __X__2.  On the morning of surgery brush your teeth with toothpaste and water, you                may rinse your mouth with mouthwash if you wish.  Do not swallow any toothpaste of mouthwash.     _X__ 3.  No Alcohol for 24 hours before or after surgery.   __ 4.  Do Not Smoke or use e-cigarettes For 24 Hours Prior to Your Surgery.                 Do not use any chewable tobacco products for at least 6 hours prior to                 surgery.  ____  5.  Bring all medications with you on the day of surgery if instructed.   _x___  6.  Notify your doctor if there is any change in your medical condition      (cold, fever, infections).     Do not wear jewelry, make-up, hairpins, clips or nail polish. Do not wear lotions, powders, or perfumes. You may wear deodorant. Do not shave 48 hours prior to surgery. Men may shave face and neck. Do not bring valuables to the hospital.    [facility] not [facility] for any belongings or valuables.  Contacts, dentures or  bridgework may not be worn into surgery. Leave your suitcase in the car. After surgery it may be brought to your room. For patients admitted to the hospital, discharge time is determined by your treatment team.   Patients discharged the day of surgery will not be allowed to drive home.   Please read over the following fact sheets that you were given:    _x___ Take these medicines the morning of surgery with A SIP OF WATER:    1. HYDROcodone-acetaminophen (NORCO/VICODIN) 5-325 MG tablet if needed  2.omeprazole (PRILOSEC) 20 MG capsule dose the night before and one the morning of surgery   3.   4.  5.  6.  ____  Fleet Enema (as directed)   __x__ Use CHG Soap as directed  ____ Use inhalers on the day of surgery  ____ Stop metformin 2 days prior to surgery    ____ Take 1/2 of usual insulin dose the night before surgery. No insulin the morning          of surgery.   ____ Stop Coumadin/Plavix/aspirin on   _x___ Stop Anti-inflammatories  ibuprofen (ADVIL) 600 MG tablet today   ____ Stop supplements until after surgery.    ____ Bring C-Pap to the hospital.

## 2019-02-07 ENCOUNTER — Other Ambulatory Visit: Payer: Self-pay

## 2019-02-07 ENCOUNTER — Encounter: Payer: Self-pay | Admitting: Family Medicine

## 2019-02-07 ENCOUNTER — Ambulatory Visit (INDEPENDENT_AMBULATORY_CARE_PROVIDER_SITE_OTHER): Payer: 59 | Admitting: Family Medicine

## 2019-02-07 ENCOUNTER — Other Ambulatory Visit
Admission: RE | Admit: 2019-02-07 | Discharge: 2019-02-07 | Disposition: A | Discharge status: Home / Self Care | Payer: 59 | Source: Ambulatory Visit | Attending: Obstetrics and Gynecology | Admitting: Obstetrics and Gynecology

## 2019-02-07 VITALS — BP 116/62 | HR 111 | Temp 97.3°F | Resp 14 | Ht 67.0 in | Wt 220.1 lb

## 2019-02-07 DIAGNOSIS — T1490XA Injury, unspecified, initial encounter: Secondary | ICD-10-CM

## 2019-02-07 DIAGNOSIS — N7011 Chronic salpingitis: Secondary | ICD-10-CM | POA: Diagnosis not present

## 2019-02-07 DIAGNOSIS — Z6831 Body mass index (BMI) 31.0-31.9, adult: Secondary | ICD-10-CM

## 2019-02-07 DIAGNOSIS — E6609 Other obesity due to excess calories: Secondary | ICD-10-CM

## 2019-02-07 DIAGNOSIS — F331 Major depressive disorder, recurrent, moderate: Secondary | ICD-10-CM

## 2019-02-07 DIAGNOSIS — Z0000 Encounter for general adult medical examination without abnormal findings: Secondary | ICD-10-CM

## 2019-02-07 DIAGNOSIS — Z1159 Encounter for screening for other viral diseases: Secondary | ICD-10-CM | POA: Diagnosis not present

## 2019-02-07 DIAGNOSIS — R102 Pelvic and perineal pain: Secondary | ICD-10-CM | POA: Diagnosis not present

## 2019-02-07 DIAGNOSIS — Z8639 Personal history of other endocrine, nutritional and metabolic disease: Secondary | ICD-10-CM

## 2019-02-07 DIAGNOSIS — Z01812 Encounter for preprocedural laboratory examination: Secondary | ICD-10-CM | POA: Diagnosis not present

## 2019-02-07 DIAGNOSIS — N6452 Nipple discharge: Secondary | ICD-10-CM | POA: Diagnosis not present

## 2019-02-07 LAB — TYPE AND SCREEN
ABO/RH(D): B POS
Antibody Screen: NEGATIVE

## 2019-02-07 LAB — SARS CORONAVIRUS 2 (TAT 6-24 HRS): SARS Coronavirus 2: NEGATIVE

## 2019-02-07 MED ORDER — BUPROPION HCL ER (XL) 150 MG PO TB24: 150.0000 mg | ORAL_TABLET | Freq: Every day | ORAL | 0 refills | Status: AC

## 2019-02-07 MED ORDER — BUSPIRONE HCL 7.5 MG PO TABS: ORAL_TABLET | ORAL | 0 refills | Status: AC

## 2019-02-07 NOTE — Progress Notes (Signed)
Name: Sharon Andrade   MRN: [id number]    DOB: Sep 02, 1985   Date:02/07/2019       Progress Note  Subjective  Chief Complaint  Chief Complaint  Patient presents with  . Annual Exam    see's gyn ? split visit  . Medication Refill    HPI  Patient presents for annual CPE.  Diet: Has been eating more protein and salads lately Exercise: She has been in a lot of pain dealing with ruptured ovarian cyst, unable to exercise at this time.   USPSTF grade A and B recommendations Not consuming alcohol   Office Visit from 02/07/2019 in [facility]  AUDIT-C Score  0     Depression: Phq 9 is  Positive - she is out of her wellbutrin and buspar - we will refill today. Depression screen [facility] 2/9 02/07/2019 01/08/2019 10/03/2018 08/02/2018 06/06/2018  Decreased Interest 2 0 3 2 0  Down, Depressed, Hopeless 2 0 2 3 0  PHQ - 2 Score 4 0 5 5 0  Altered sleeping 2 0 2 3 0  Tired, decreased energy 3 0 2 2 0  Change in appetite 2 0 2 2 0  Feeling bad or failure about yourself  3 0 3 3 0  Trouble concentrating 0 0 0 2 0  Moving slowly or fidgety/restless 0 0 0 0 0  Suicidal thoughts 0 0 0 0 0  PHQ-9 Score 14 0 14 17 0  Difficult doing work/chores Somewhat difficult Not difficult at all Somewhat difficult Very difficult Not difficult at all   Hypertension: BP Readings from Last 3 Encounters:  02/07/19 116/62  01/28/19 116/60  01/24/19 123/75   Obesity: Wt Readings from Last 3 Encounters:  02/07/19 220 lb 1.6 oz (99.8 kg)  02/04/19 215 lb (97.5 kg)  01/28/19 223 lb (101.2 kg)   BMI Readings from Last 3 Encounters:  02/07/19 34.47 kg/m  02/04/19 33.67 kg/m  01/28/19 34.93 kg/m    Hep C Screening: Hep C negative in 2019 STD testing and prevention (HIV/chl/gon/syphilis): Negative 2019 for HIV; declines additional STI screening Intimate partner violence: No concerning symptoms today Sexual History/Pain during Intercourse: Currently, she is having too much pain for intercourse  - awaiting surgical intervention with GYN. Menstrual History/LMP/Abnormal Bleeding: S/p hysterectomy.  She is having some vaginal discharge and some vaginal bleeding - is undergoing procedure next week with GYN. Incontinence Symptoms: Denies concerns.   Advanced Care Planning: A voluntary discussion about advance care planning including the explanation and discussion of advance directives.  Discussed health care proxy and Living will, and the patient was able to identify a health care proxy as Faren Florence.  Patient does not have a living will at present time. If patient does have living will, I have requested they bring this to the clinic to be scanned in to their chart.  Breast cancer: She recently noticed LEFT nipple discharge (white, thick) a couple of months ago.   She is also having pain in the LEFT breast.  Will send for mammogram and check prolactin. No results found for: HMMAMMO  BRCA gene screening: Maternal Aunt with history of breast cancer. Cervical cancer screening: Negative 2019; no concerns today  Osteoporosis Screening: No family history; not taking vitamin D supplement, but did have deficiency at one point No results found for: HMDEXASCAN  Lipids:  Lab Results  Component Value Date   CHOL 131 06/06/2018   Lab Results  Component Value Date   HDL 50 (L) 06/06/2018  Lab Results  Component Value Date   LDLCALC 68 06/06/2018   Lab Results  Component Value Date   TRIG 47 06/06/2018   Lab Results  Component Value Date   CHOLHDL 2.6 06/06/2018   No results found for: LDLDIRECT  Glucose:  Glucose  Date Value Ref Range Status  01/21/2019 82 65 - 99 mg/dL Final  03/01/2014 96 65 - 99 mg/dL Final  02/28/2014 131 (H) 65 - 99 mg/dL Final  02/27/2014 125 (H) 65 - 99 mg/dL Final   Glucose, Bld  Date Value Ref Range Status  01/23/2019 115 (H) 70 - 99 mg/dL Final  10/03/2018 87 70 - 99 mg/dL Final  06/22/2018 113 (H) 70 - 99 mg/dL Final    Skin cancer: No  concerning lesions Colorectal cancer: Seeing GI - has colonoscopy scheduled.   Lung cancer:  Never smoker. Low Dose CT Chest recommended if Age 81-80 years, 30 pack-year currently smoking OR have quit w/in 15years. Patient does not qualify.   ECG: No chest pain, shortness of breath, palpitations.  Patient Active Problem List   Diagnosis Date Noted  . Constipation by delayed colonic transit 01/08/2019  . Blood in stool 01/08/2019  . Abnormal vaginal bleeding 06/06/2018  . Trauma in childhood 06/06/2018    Past Surgical History:  Procedure Laterality Date  . ABDOMINAL HYSTERECTOMY    . CHOLECYSTECTOMY    . COMBINED HYSTEROSCOPY DIAGNOSTIC / D&C     Several d/t multiple miscarriages  . TUBAL LIGATION      Family History  Problem Relation Age of Onset  . COPD Mother        smoker  . Ovarian cancer Mother   . Emphysema Father        smoker  . COPD Maternal Grandmother        smoker  . Colon cancer Maternal Grandfather   . Depression Paternal Grandfather        Shot himself; pt found him when she was 33yo  . Breast cancer Maternal Aunt   . Ovarian cancer Maternal Aunt   . Pancreatic cancer Maternal Uncle     Social History   Socioeconomic History  . Marital status: Married    Spouse name: Lennette Bihari  . Number of children: 2  . Years of education: Not on file  . Highest education level: Not on file  Occupational History  . Not on file  Social Needs  . Financial resource strain: Not hard at all  . Food insecurity    Worry: Never true    Inability: Never true  . Transportation needs    Medical: No    Non-medical: No  Tobacco Use  . Smoking status: Never Smoker  . Smokeless tobacco: Never Used  Substance and Sexual Activity  . Alcohol use: Never    Frequency: Never  . Drug use: Never  . Sexual activity: Yes    Partners: Male    Birth control/protection: Surgical    Comment: Hysterectomy  Lifestyle  . Physical activity    Days per week: 3 days    Minutes per  session: 30 min  . Stress: Not at all  Relationships  . Social connections    Talks on phone: More than three times a week    Gets together: More than three times a week    Attends religious service: Never    Active member of club or organization: No    Attends meetings of clubs or organizations: Never    Relationship status: Married  .  Intimate partner violence    Fear of current or ex partner: No    Emotionally abused: No    Physically abused: No    Forced sexual activity: No  Other Topics Concern  . Not on file  Social History Narrative   33yo and 33yo; married to her husband     Current Outpatient Medications:  .  albuterol (PROVENTIL HFA;VENTOLIN HFA) 108 (90 Base) MCG/ACT inhaler, Inhale into the lungs., Disp: , Rfl:  .  HYDROcodone-acetaminophen (NORCO/VICODIN) 5-325 MG tablet, Take 1 tablet by mouth every 6 (six) hours as needed. (Patient taking differently: Take 1 tablet by mouth every 6 (six) hours as needed for moderate pain. ), Disp: 12 tablet, Rfl: 0 .  Multiple Vitamin (MULTIVITAMIN) tablet, Take 1 tablet by mouth daily., Disp: , Rfl:  .  omeprazole (PRILOSEC) 20 MG capsule, Take 1 capsule (20 mg total) by mouth daily for 30 days., Disp: 30 capsule, Rfl: 0 .  ondansetron (ZOFRAN) 4 MG tablet, Take 1 tablet (4 mg total) by mouth every 8 (eight) hours as needed for nausea or vomiting., Disp: 20 tablet, Rfl: 0 .  polyethylene glycol (MIRALAX) 17 g packet, Use twice daily until stools are regular, then once daily., Disp: 100 each, Rfl: 1 .  buPROPion (WELLBUTRIN XL) 150 MG 24 hr tablet, TAKE 1 TABLET (150 MG TOTAL) BY MOUTH DAILY. (Patient not taking: Reported on 02/04/2019), Disp: 15 tablet, Rfl: 0 .  busPIRone (BUSPAR) 7.5 MG tablet, TAKE 1 TABLET BY MOUTH NIGHTLY. AFTER 7 DAYS, MAY INCREASE TO 1 TABLET BY MOUTH EVERY 8 HOURS AS NEEDED FOR ANXIETY (MAX 3 TABLETS PER DAY) (Patient not taking: Reported on 02/04/2019), Disp: 60 tablet, Rfl: 0 .  ibuprofen (ADVIL) 600 MG tablet,  Take 1 tablet (600 mg total) by mouth every 6 (six) hours as needed. (Patient not taking: Reported on 02/07/2019), Disp: 60 tablet, Rfl: 3 .  metoCLOPramide (REGLAN) 10 MG tablet, Take 1 tablet (10 mg total) by mouth every 8 (eight) hours as needed for up to 3 days for nausea., Disp: 20 tablet, Rfl: 0  Allergies  Allergen Reactions  . Penicillins Rash and Other (See Comments)    Did it involve swelling of the face/tongue/throat, SOB, or low BP? No Did it involve sudden or severe rash/hives, skin peeling, or any reaction on the inside of your mouth or nose? Yes Did you need to seek medical attention at a hospital or doctor's office? No When did it last happen? Baby If all above answers are "NO", may proceed with cephalosporin use.   . Tramadol Palpitations     ROS  Ten systems reviewed and is negative except as mentioned in HPI  Objective  Vitals:   02/07/19 1241  BP: 116/62  Pulse: (!) 111  Resp: 14  Temp: (!) 97.3 F (36.3 C)  SpO2: 97%  Weight: 220 lb 1.6 oz (99.8 kg)  Height: _0  (1.702 m)    Body mass index is 34.47 kg/m.  Physical Exam  Constitutional: Patient appears well-developed and well-nourished. No distress.  HENT: Head: Normocephalic and atraumatic. Ears: B TMs ok, no erythema or effusion; Nose: Nose normal. Mouth/Throat: Oropharynx is clear and moist. No oropharyngeal exudate.  Eyes: Conjunctivae and EOM are normal. Pupils are equal, round, and reactive to light. No scleral icterus.  Neck: Normal range of motion. Neck supple. No JVD present. No thyromegaly present.  Cardiovascular: Normal rate, regular rhythm and normal heart sounds.  No murmur heard. No BLE edema. Pulmonary/Chest: Effort normal  and breath sounds normal. No respiratory distress. Abdominal: Soft. Bowel sounds are normal, no distension. There is no tenderness. no masses Breast: no lumps or masses or rashes; LEFT nipple with white discharge was NOT able to be produced with attempted manual  expression. FEMALE GENITALIA: Deferred Musculoskeletal: Normal range of motion, no joint effusions. No gross deformities Neurological: he is alert and oriented to person, place, and time. No cranial nerve deficit. Coordination, balance, strength, speech and gait are normal.  Skin: Skin is warm and dry. No rash noted. No erythema.  Psychiatric: Patient has a normal mood and affect. behavior is normal. Judgment and thought content normal.   Recent Results (from the past 2160 hour(s))  CBC     Status: Abnormal   Collection Time: 01/21/19  2:02 PM  Result Value Ref Range   WBC 13.6 (H) 3.4 - 10.8 x10E3/uL   RBC 4.83 3.77 - 5.28 x10E6/uL   Hemoglobin 14.3 11.1 - 15.9 g/dL   Hematocrit 41.9 34.0 - 46.6 %   MCV 87 79 - 97 fL   MCH 29.6 26.6 - 33.0 pg   MCHC 34.1 31.5 - 35.7 g/dL   RDW 12.0 11.7 - 15.4 %   Platelets 334 150 - 450 x10E3/uL  Comprehensive metabolic panel     Status: Abnormal   Collection Time: 01/21/19  2:03 PM  Result Value Ref Range   Glucose 82 65 - 99 mg/dL   BUN 23 (H) 6 - 20 mg/dL   Creatinine, Ser 0.76 0.57 - 1.00 mg/dL   GFR calc non Af Amer 102 >59 mL/min/1.73   GFR calc Af Amer 118 >59 mL/min/1.73   BUN/Creatinine Ratio 30 (H) 9 - 23   Sodium 138 134 - 144 mmol/L   Potassium 4.1 3.5 - 5.2 mmol/L   Chloride 102 96 - 106 mmol/L   CO2 20 20 - 29 mmol/L   Calcium 9.4 8.7 - 10.2 mg/dL   Total Protein 7.0 6.0 - 8.5 g/dL   Albumin 4.5 3.8 - 4.8 g/dL   Globulin, Total 2.5 1.5 - 4.5 g/dL   Albumin/Globulin Ratio 1.8 1.2 - 2.2   Bilirubin Total 0.3 0.0 - 1.2 mg/dL   Alkaline Phosphatase 76 39 - 117 IU/L   AST 17 0 - 40 IU/L   ALT 20 0 - 32 IU/L  Lipase, blood     Status: None   Collection Time: 01/23/19  8:12 PM  Result Value Ref Range   Lipase 43 11 - 51 U/L    Comment: Performed at [facility], [address] [place], [place] [address]  Comprehensive metabolic panel     Status: Abnormal   Collection Time: 01/23/19  8:12 PM  Result Value Ref Range    Sodium 140 135 - 145 mmol/L   Potassium 3.9 3.5 - 5.1 mmol/L   Chloride 106 98 - 111 mmol/L   CO2 26 22 - 32 mmol/L   Glucose, Bld 115 (H) 70 - 99 mg/dL   BUN 19 6 - 20 mg/dL   Creatinine, Ser 0.72 0.44 - 1.00 mg/dL   Calcium 8.9 8.9 - 10.3 mg/dL   Total Protein 7.2 6.5 - 8.1 g/dL   Albumin 4.1 3.5 - 5.0 g/dL   AST 21 15 - 41 U/L   ALT 21 0 - 44 U/L   Alkaline Phosphatase 74 38 - 126 U/L   Total Bilirubin 0.2 (L) 0.3 - 1.2 mg/dL   GFR calc non Af Amer >60 >60 mL/min   GFR  calc Af Amer >60 >60 mL/min   Anion gap 8 5 - 15    Comment: Performed at [facility], [address] [place], [place] [address]  CBC     Status: None   Collection Time: 01/23/19  8:12 PM  Result Value Ref Range   WBC 10.4 4.0 - 10.5 K/uL   RBC 4.83 3.87 - 5.11 MIL/uL   Hemoglobin 14.3 12.0 - 15.0 g/dL   HCT 41.2 36.0 - 46.0 %   MCV 85.3 80.0 - 100.0 fL   MCH 29.6 26.0 - 34.0 pg   MCHC 34.7 30.0 - 36.0 g/dL   RDW 12.3 11.5 - 15.5 %   Platelets 339 150 - 400 K/uL   nRBC 0.0 0.0 - 0.2 %    Comment: Performed at [facility], [address] [place], [place] [address]  Urinalysis, Complete w Microscopic     Status: Abnormal   Collection Time: 01/23/19  8:12 PM  Result Value Ref Range   Color, Urine YELLOW (A) YELLOW   APPearance HAZY (A) CLEAR   Specific Gravity, Urine 1.024 1.005 - 1.030   pH 6.0 5.0 - 8.0   Glucose, UA NEGATIVE NEGATIVE mg/dL   Hgb urine dipstick NEGATIVE NEGATIVE   Bilirubin Urine NEGATIVE NEGATIVE   Ketones, ur NEGATIVE NEGATIVE mg/dL   Protein, ur NEGATIVE NEGATIVE mg/dL   Nitrite NEGATIVE NEGATIVE   Leukocytes,Ua NEGATIVE NEGATIVE   RBC / HPF 0-5 0 - 5 RBC/hpf   WBC, UA 0-5 0 - 5 WBC/hpf   Bacteria, UA NONE SEEN NONE SEEN   Squamous Epithelial / LPF 6-10 0 - 5   Mucus PRESENT     Comment: Performed at [facility], [address] [place], [place] [address]  Wet prep, genital     Status: None   Collection Time: 01/24/19  1:09 AM   Specimen:  Cervix  Result Value Ref Range   Yeast Wet Prep HPF POC NONE SEEN NONE SEEN   Trich, Wet Prep NONE SEEN NONE SEEN   Clue Cells Wet Prep HPF POC NONE SEEN NONE SEEN   WBC, Wet Prep HPF POC NONE SEEN NONE SEEN   Sperm NONE SEEN     Comment: Performed at [facility], [address] [place], [place] [address]  [place] rt PCR [facility] only)     Status: None   Collection Time: 01/24/19  1:09 AM   Specimen: Cervix  Result Value Ref Range   Specimen source GC/Chlam ENDOCERVICAL    Chlamydia Tr NOT DETECTED NOT DETECTED   N gonorrhoeae NOT DETECTED NOT DETECTED    Comment: (NOTE) This CT/NG assay has not been evaluated in patients with a history of  hysterectomy. Performed at [facility], [address] [place], [place] [address]   Type and screen [place]     Status: None   Collection Time: 02/07/19 10:17 AM  Result Value Ref Range   ABO/RH(D) B POS    Antibody Screen NEG    Sample Expiration 02/21/2019,2359    Extend sample reason      NO TRANSFUSIONS OR PREGNANCY IN THE PAST 3 MONTHS Performed at [facility], [address] [place], [place] [address]     PHQ2/9: Depression screen [facility] 2/9 02/07/2019 01/08/2019 10/03/2018 08/02/2018 06/06/2018  Decreased Interest 2 0 3 2 0  Down, Depressed, Hopeless 2 0 2 3 0  PHQ - 2 Score 4 0 5 5 0  Altered sleeping 2  0 2 3 0  Tired, decreased energy 3 0 2 2 0  Change in appetite 2 0 2 2 0  Feeling bad or failure about yourself  3 0 3 3 0  Trouble concentrating 0 0 0 2 0  Moving slowly or fidgety/restless 0 0 0 0 0  Suicidal thoughts 0 0 0 0 0  PHQ-9 Score 14 0 14 17 0  Difficult doing work/chores Somewhat difficult Not difficult at all Somewhat difficult Very difficult Not difficult at all     Fall Risk: Fall Risk  02/07/2019 01/08/2019 10/03/2018 08/02/2018 06/06/2018  Falls in the past year? 0 0 0 0 0  Number falls in past yr: 0 0 0 0 -  Injury with Fall? 0 0 0 0 -  Follow up - Falls  evaluation completed - - -    Functional Status Survey: Is the patient deaf or have difficulty hearing?: No Does the patient have difficulty seeing, even when wearing glasses/contacts?: No Does the patient have difficulty concentrating, remembering, or making decisions?: No Does the patient have difficulty walking or climbing stairs?: No Does the patient have difficulty dressing or bathing?: No Does the patient have difficulty doing errands alone such as visiting a doctor's office or shopping?: No   Assessment & Plan  1. Well woman exam (no gynecological exam) -USPSTF grade A and B recommendations reviewed with patient; age-appropriate recommendations, preventive care, screening tests, etc discussed and encouraged; healthy living encouraged; see AVS for patient education given to patient -Discussed importance of 150 minutes of physical activity weekly, eat two servings of fish weekly, eat one serving of tree nuts ( cashews, pistachios, pecans, almonds.Marland Kitchen) every other day, eat 6 servings of fruit/vegetables daily and drink plenty of water and avoid sweet beverages.  - COMPLETE METABOLIC PANEL WITH GFR - Lipid panel  2. Nipple discharge in female - MM Digital Diagnostic Unilat L; Future - MM Digital Diagnostic Unilat R; Future - Prolactin  3. Trauma in childhood - busPIRone (BUSPAR) 7.5 MG tablet; TAKE 1 TABLET BY MOUTH NIGHTLY. AFTER 7 DAYS, MAY INCREASE TO 1 TABLET BY MOUTH EVERY 8 HOURS AS NEEDED FOR ANXIETY (MAX 3 TABLETS PER DAY)  Dispense: 60 tablet; Refill: 0 - buPROPion (WELLBUTRIN XL) 150 MG 24 hr tablet; Take 1 tablet (150 mg total) by mouth daily.  Dispense: 30 tablet; Refill: 0  4. Moderate episode of recurrent major depressive disorder (HCC) - busPIRone (BUSPAR) 7.5 MG tablet; TAKE 1 TABLET BY MOUTH NIGHTLY. AFTER 7 DAYS, MAY INCREASE TO 1 TABLET BY MOUTH EVERY 8 HOURS AS NEEDED FOR ANXIETY (MAX 3 TABLETS PER DAY)  Dispense: 60 tablet; Refill: 0 - buPROPion (WELLBUTRIN XL)  150 MG 24 hr tablet; Take 1 tablet (150 mg total) by mouth daily.  Dispense: 30 tablet; Refill: 0  5. Class 1 obesity due to excess calories without serious comorbidity with body mass index (BMI) of 31.0 to 31.9 in adult - buPROPion (WELLBUTRIN XL) 150 MG 24 hr tablet; Take 1 tablet (150 mg total) by mouth daily.  Dispense: 30 tablet; Refill: 0  6. History of vitamin D deficiency - VITAMIN D 25 Hydroxy (Vit-D Deficiency, Fractures)

## 2019-02-07 NOTE — Patient Instructions (Signed)
Preventive Care 21-35 Years Old, Female Preventive care refers to visits with your health care provider and lifestyle choices that can promote health and wellness. This includes:  A yearly physical exam. This may also be called an annual well check.  Regular dental visits and eye exams.  Immunizations.  Screening for certain conditions.  Healthy lifestyle choices, such as eating a healthy diet, getting regular exercise, not using drugs or products that contain nicotine and tobacco, and limiting alcohol use. What can I expect for my preventive care visit? Physical exam Your health care provider will check your:  Height and weight. This may be used to calculate body mass index (BMI), which tells if you are at a healthy weight.  Heart rate and blood pressure.  Skin for abnormal spots. Counseling Your health care provider may ask you questions about your:  Alcohol, tobacco, and drug use.  Emotional well-being.  Home and relationship well-being.  Sexual activity.  Eating habits.  Work and work environment.  Method of birth control.  Menstrual cycle.  Pregnancy history. What immunizations do I need?  Influenza (flu) vaccine  This is recommended every year. Tetanus, diphtheria, and pertussis (Tdap) vaccine  You may need a Td booster every 10 years. Varicella (chickenpox) vaccine  You may need this if you have not been vaccinated. Human papillomavirus (HPV) vaccine  If recommended by your health care provider, you may need three doses over 6 months. Measles, mumps, and rubella (MMR) vaccine  You may need at least one dose of MMR. You may also need a second dose. Meningococcal conjugate (MenACWY) vaccine  One dose is recommended if you are age 19-21 years and a first-year college student living in a residence hall, or if you have one of several medical conditions. You may also need additional booster doses. Pneumococcal conjugate (PCV13) vaccine  You may need  this if you have certain conditions and were not previously vaccinated. Pneumococcal polysaccharide (PPSV23) vaccine  You may need one or two doses if you smoke cigarettes or if you have certain conditions. Hepatitis A vaccine  You may need this if you have certain conditions or if you travel or work in places where you may be exposed to hepatitis A. Hepatitis B vaccine  You may need this if you have certain conditions or if you travel or work in places where you may be exposed to hepatitis B. Haemophilus influenzae type b (Hib) vaccine  You may need this if you have certain conditions. You may receive vaccines as individual doses or as more than one vaccine together in one shot (combination vaccines). Talk with your health care provider about the risks and benefits of combination vaccines. What tests do I need?  Blood tests  Lipid and cholesterol levels. These may be checked every 5 years starting at age 20.  Hepatitis C test.  Hepatitis B test. Screening  Diabetes screening. This is done by checking your blood sugar (glucose) after you have not eaten for a while (fasting).  Sexually transmitted disease (STD) testing.  BRCA-related cancer screening. This may be done if you have a family history of breast, ovarian, tubal, or peritoneal cancers.  Pelvic exam and Pap test. This may be done every 3 years starting at age 21. Starting at age 30, this may be done every 5 years if you have a Pap test in combination with an HPV test. Talk with your health care provider about your test results, treatment options, and if necessary, the need for more tests.   Follow these instructions at home: Eating and drinking   Eat a diet that includes fresh fruits and vegetables, whole grains, lean protein, and low-fat dairy.  Take vitamin and mineral supplements as recommended by your health care provider.  Do not drink alcohol if: ? Your health care provider tells you not to drink. ? You are  pregnant, may be pregnant, or are planning to become pregnant.  If you drink alcohol: ? Limit how much you have to 0-1 drink a day. ? Be aware of how much alcohol is in your drink. In the [place] one drink equals one 12 oz bottle of beer (355 mL), one 5 oz glass of wine (148 mL), or one 1 oz glass of hard liquor (44 mL). Lifestyle  Take daily care of your teeth and gums.  Stay active. Exercise for at least 30 minutes on 5 or more days each week.  Do not use any products that contain nicotine or tobacco, such as cigarettes, e-cigarettes, and chewing tobacco. If you need help quitting, ask your health care provider.  If you are sexually active, practice safe sex. Use a condom or other form of birth control (contraception) in order to prevent pregnancy and STIs (sexually transmitted infections). If you plan to become pregnant, see your health care provider for a preconception visit. What's next?  Visit your health care provider once a year for a well check visit.  Ask your health care provider how often you should have your eyes and teeth checked.  Stay up to date on all vaccines. This information is not intended to replace advice given to you by your health care provider. Make sure you discuss any questions you have with your health care provider. Document Released: 09/12/2001 Document Revised: 03/28/2018 Document Reviewed: 03/28/2018 Elsevier Patient Education  2020 [employer].

## 2019-02-08 LAB — LIPID PANEL
Cholesterol: 132 mg/dL (ref <200)
HDL: 46 mg/dL — ABNORMAL LOW (ref >=50)
LDL Cholesterol (Calc): 72 mg/dL (calc)
Non-HDL Cholesterol (Calc): 86 mg/dL (calc) (ref <130)
Total CHOL/HDL Ratio: 2.9 (calc) (ref <5.0)
Triglycerides: 59 mg/dL (ref <150)

## 2019-02-08 LAB — COMPLETE METABOLIC PANEL WITH GFR
AG Ratio: 1.6 (calc) (ref 1.0–2.5)
ALT: 15 U/L (ref 6–29)
AST: 12 U/L (ref 10–30)
Albumin: 4.1 g/dL (ref 3.6–5.1)
Alkaline phosphatase (APISO): 70 U/L (ref 31–125)
BUN: 13 mg/dL (ref 7–25)
CO2: 25 mmol/L (ref 20–32)
Calcium: 9.4 mg/dL (ref 8.6–10.2)
Chloride: 104 mmol/L (ref 98–110)
Creat: 0.64 mg/dL (ref 0.50–1.10)
GFR, Est African American: 134 mL/min/{1.73_m2} (ref >=60)
GFR, Est Non African American: 116 mL/min/{1.73_m2} (ref >=60)
Globulin: 2.5 g/dL (calc) (ref 1.9–3.7)
Glucose, Bld: 87 mg/dL (ref 65–99)
Potassium: 4 mmol/L (ref 3.5–5.3)
Sodium: 138 mmol/L (ref 135–146)
Total Bilirubin: 0.4 mg/dL (ref 0.2–1.2)
Total Protein: 6.6 g/dL (ref 6.1–8.1)

## 2019-02-08 LAB — VITAMIN D 25 HYDROXY (VIT D DEFICIENCY, FRACTURES): Vit D, 25-Hydroxy: 32 ng/mL (ref 30–100)

## 2019-02-08 LAB — PROLACTIN: Prolactin: 9.8 ng/mL

## 2019-02-11 ENCOUNTER — Encounter: Payer: Self-pay | Admitting: *Deleted

## 2019-02-11 ENCOUNTER — Other Ambulatory Visit: Payer: Self-pay

## 2019-02-11 ENCOUNTER — Ambulatory Visit: Payer: 59 | Admitting: Anesthesiology

## 2019-02-11 ENCOUNTER — Ambulatory Visit
Admission: RE | Admit: 2019-02-11 | Discharge: 2019-02-11 | Disposition: A | Discharge status: Home / Self Care | Payer: 59 | Attending: Obstetrics and Gynecology | Admitting: Obstetrics and Gynecology

## 2019-02-11 ENCOUNTER — Encounter
Admission: RE | Disposition: A | Discharge status: Home / Self Care | Payer: Self-pay | Source: Home / Self Care | Attending: Obstetrics and Gynecology

## 2019-02-11 DIAGNOSIS — N838 Other noninflammatory disorders of ovary, fallopian tube and broad ligament: Secondary | ICD-10-CM | POA: Diagnosis not present

## 2019-02-11 DIAGNOSIS — R102 Pelvic and perineal pain: Secondary | ICD-10-CM | POA: Diagnosis not present

## 2019-02-11 DIAGNOSIS — F329 Major depressive disorder, single episode, unspecified: Secondary | ICD-10-CM | POA: Insufficient documentation

## 2019-02-11 DIAGNOSIS — N7011 Chronic salpingitis: Secondary | ICD-10-CM | POA: Insufficient documentation

## 2019-02-11 DIAGNOSIS — Z9889 Other specified postprocedural states: Secondary | ICD-10-CM

## 2019-02-11 DIAGNOSIS — N8302 Follicular cyst of left ovary: Secondary | ICD-10-CM | POA: Diagnosis not present

## 2019-02-11 DIAGNOSIS — G8929 Other chronic pain: Secondary | ICD-10-CM | POA: Diagnosis not present

## 2019-02-11 DIAGNOSIS — Z8041 Family history of malignant neoplasm of ovary: Secondary | ICD-10-CM | POA: Insufficient documentation

## 2019-02-11 HISTORY — PX: LAPAROSCOPIC BILATERAL SALPINGECTOMY: SHX5889

## 2019-02-11 HISTORY — PX: LAPAROSCOPY: SHX197

## 2019-02-11 SURGERY — SALPINGECTOMY, BILATERAL, LAPAROSCOPIC
Anesthesia: General | Site: Abdomen

## 2019-02-11 MED ORDER — PROPOFOL 10 MG/ML IV BOLUS
INTRAVENOUS | Status: DC | PRN
  Administered 2019-02-11: 200 mg via INTRAVENOUS

## 2019-02-11 MED ORDER — ACETAMINOPHEN 10 MG/ML IV SOLN
INTRAVENOUS | Status: DC | PRN
  Administered 2019-02-11: 1000 mg via INTRAVENOUS

## 2019-02-11 MED ORDER — OXYCODONE HCL 5 MG PO TABS: 5.0000 mg | ORAL_TABLET | Freq: Once | ORAL | Status: DC | PRN

## 2019-02-11 MED ORDER — KETOROLAC TROMETHAMINE 30 MG/ML IJ SOLN
INTRAMUSCULAR | Status: DC | PRN
  Administered 2019-02-11: 30 mg via INTRAVENOUS

## 2019-02-11 MED ORDER — DEXAMETHASONE SODIUM PHOSPHATE 10 MG/ML IJ SOLN
INTRAMUSCULAR | Status: DC | PRN
  Administered 2019-02-11: 10 mg via INTRAVENOUS

## 2019-02-11 MED ORDER — LACTATED RINGERS IV SOLN
INTRAVENOUS | Status: DC
  Administered 2019-02-11 (×2): via INTRAVENOUS

## 2019-02-11 MED ORDER — FAMOTIDINE 20 MG PO TABS
ORAL_TABLET | ORAL | Status: AC
  Filled 2019-02-11: qty 1

## 2019-02-11 MED ORDER — FENTANYL CITRATE (PF) 100 MCG/2ML IJ SOLN
INTRAMUSCULAR | Status: AC
  Filled 2019-02-11: qty 2

## 2019-02-11 MED ORDER — SUGAMMADEX SODIUM 200 MG/2ML IV SOLN
INTRAVENOUS | Status: DC | PRN
  Administered 2019-02-11: 199.6 mg via INTRAVENOUS

## 2019-02-11 MED ORDER — MIDAZOLAM HCL 2 MG/2ML IJ SOLN
INTRAMUSCULAR | Status: AC
  Filled 2019-02-11: qty 2

## 2019-02-11 MED ORDER — MEPERIDINE HCL 50 MG/ML IJ SOLN: 6.2500 mg | INTRAMUSCULAR | Status: DC | PRN

## 2019-02-11 MED ORDER — ROCURONIUM BROMIDE 100 MG/10ML IV SOLN
INTRAVENOUS | Status: DC | PRN
  Administered 2019-02-11: 50 mg via INTRAVENOUS
  Administered 2019-02-11: 10 mg via INTRAVENOUS

## 2019-02-11 MED ORDER — BUPIVACAINE HCL (PF) 0.5 % IJ SOLN
INTRAMUSCULAR | Status: AC
  Filled 2019-02-11: qty 30

## 2019-02-11 MED ORDER — IBUPROFEN 600 MG PO TABS: 600.0000 mg | ORAL_TABLET | Freq: Four times a day (QID) | ORAL | 0 refills | Status: DC | PRN

## 2019-02-11 MED ORDER — BUPIVACAINE HCL 0.5 % IJ SOLN
INTRAMUSCULAR | Status: DC | PRN
  Administered 2019-02-11: 16 mL

## 2019-02-11 MED ORDER — HEMOSTATIC AGENTS (NO CHARGE) OPTIME
TOPICAL | Status: DC | PRN
  Administered 2019-02-11: 1 via TOPICAL

## 2019-02-11 MED ORDER — OXYCODONE HCL 5 MG/5ML PO SOLN: 5.0000 mg | Freq: Once | ORAL | Status: DC | PRN

## 2019-02-11 MED ORDER — PROMETHAZINE HCL 25 MG/ML IJ SOLN: 6.2500 mg | INTRAMUSCULAR | Status: DC | PRN

## 2019-02-11 MED ORDER — FENTANYL CITRATE (PF) 100 MCG/2ML IJ SOLN
INTRAMUSCULAR | Status: DC | PRN
  Administered 2019-02-11 (×4): 50 ug via INTRAVENOUS
  Administered 2019-02-11: 100 ug via INTRAVENOUS

## 2019-02-11 MED ORDER — PROPOFOL 10 MG/ML IV BOLUS
INTRAVENOUS | Status: AC
  Filled 2019-02-11: qty 40

## 2019-02-11 MED ORDER — OXYCODONE-ACETAMINOPHEN 5-325 MG PO TABS: 1.0000 | ORAL_TABLET | ORAL | 0 refills | Status: AC | PRN

## 2019-02-11 MED ORDER — FAMOTIDINE 20 MG PO TABS
20.0000 mg | ORAL_TABLET | Freq: Once | ORAL | Status: AC
  Administered 2019-02-11: 20 mg via ORAL

## 2019-02-11 MED ORDER — FENTANYL CITRATE (PF) 100 MCG/2ML IJ SOLN: 25.0000 ug | INTRAMUSCULAR | Status: DC | PRN

## 2019-02-11 MED ORDER — MIDAZOLAM HCL 2 MG/2ML IJ SOLN
INTRAMUSCULAR | Status: DC | PRN
  Administered 2019-02-11: 2 mg via INTRAVENOUS

## 2019-02-11 MED ORDER — LACTATED RINGERS IR SOLN
Status: DC | PRN
  Administered 2019-02-11: 1000 mL

## 2019-02-11 MED ORDER — ACETAMINOPHEN NICU IV SYRINGE 10 MG/ML
INTRAVENOUS | Status: AC
  Filled 2019-02-11: qty 1

## 2019-02-11 MED ORDER — LIDOCAINE HCL (CARDIAC) PF 100 MG/5ML IV SOSY
PREFILLED_SYRINGE | INTRAVENOUS | Status: DC | PRN
  Administered 2019-02-11: 100 mg via INTRAVENOUS

## 2019-02-11 SURGICAL SUPPLY — 39 items
ANCHOR TIS RET SYS 235ML (MISCELLANEOUS) IMPLANT
APPLICATOR ARISTA FLEXITIP XL (MISCELLANEOUS) ×1 IMPLANT
BAG URINE DRAINAGE (UROLOGICAL SUPPLIES) ×2 IMPLANT
BLADE SURG SZ11 CARB STEEL (BLADE) ×2 IMPLANT
CANISTER SUCT 1200ML W/VALVE (MISCELLANEOUS) ×2 IMPLANT
CATH FOLEY 2WAY  5CC 16FR (CATHETERS) ×1
CATH URTH 16FR FL 2W BLN LF (CATHETERS) ×1 IMPLANT
CHLORAPREP W/TINT 26 (MISCELLANEOUS) ×2 IMPLANT
COVER WAND RF STERILE (DRAPES) ×2 IMPLANT
DEFOGGER SCOPE WARMER CLEARIFY (MISCELLANEOUS) ×1 IMPLANT
DERMABOND ADVANCED (GAUZE/BANDAGES/DRESSINGS) ×1
DERMABOND ADVANCED .7 DNX12 (GAUZE/BANDAGES/DRESSINGS) ×1 IMPLANT
GLOVE BIO SURGEON STRL SZ7 (GLOVE) ×4 IMPLANT
GLOVE INDICATOR 7.5 STRL GRN (GLOVE) ×4 IMPLANT
GOWN STRL REUS W/ TWL LRG LVL3 (GOWN DISPOSABLE) ×2 IMPLANT
GOWN STRL REUS W/ TWL XL LVL3 (GOWN DISPOSABLE) IMPLANT
GOWN STRL REUS W/TWL LRG LVL3 (GOWN DISPOSABLE) ×2
GOWN STRL REUS W/TWL XL LVL3 (GOWN DISPOSABLE)
GRASPER SUT TROCAR 14GX15 (MISCELLANEOUS) IMPLANT
HEMOSTAT ARISTA ABSORB 3G PWDR (HEMOSTASIS) ×1 IMPLANT
IRRIGATION STRYKERFLOW (MISCELLANEOUS) IMPLANT
IRRIGATOR STRYKERFLOW (MISCELLANEOUS) ×2
IV LACTATED RINGERS 1000ML (IV SOLUTION) ×2 IMPLANT
KIT PINK PAD W/HEAD ARE REST (MISCELLANEOUS) ×2
KIT PINK PAD W/HEAD ARM REST (MISCELLANEOUS) ×1 IMPLANT
KIT TURNOVER CYSTO (KITS) ×2 IMPLANT
LABEL OR SOLS (LABEL) ×2 IMPLANT
NS IRRIG 500ML POUR BTL (IV SOLUTION) ×2 IMPLANT
PACK GYN LAPAROSCOPIC (MISCELLANEOUS) ×2 IMPLANT
PAD OB MATERNITY 4.3X12.25 (PERSONAL CARE ITEMS) ×2 IMPLANT
PAD PREP 24X41 OB/GYN DISP (PERSONAL CARE ITEMS) ×2 IMPLANT
SCISSORS METZENBAUM CVD 33 (INSTRUMENTS) IMPLANT
SET TUBE SMOKE EVAC HIGH FLOW (TUBING) ×2 IMPLANT
SHEARS HARMONIC ACE PLUS 36CM (ENDOMECHANICALS) ×2 IMPLANT
SLEEVE ENDOPATH XCEL 5M (ENDOMECHANICALS) ×2 IMPLANT
SUT MNCRL AB 4-0 PS2 18 (SUTURE) ×2 IMPLANT
SUT VIC AB 2-0 UR6 27 (SUTURE) ×2 IMPLANT
TROCAR ENDO BLADELESS 11MM (ENDOMECHANICALS) ×2 IMPLANT
TROCAR XCEL NON-BLD 5MMX100MML (ENDOMECHANICALS) ×2 IMPLANT

## 2019-02-11 NOTE — Anesthesia Post-op Follow-up Note (Signed)
Anesthesia QCDR form completed.        

## 2019-02-11 NOTE — Anesthesia Procedure Notes (Signed)
Procedure Name: Intubation Date/Time: 02/11/2019 2:10 PM Performed by: Philbert Riser, CRNA Pre-anesthesia Checklist: Patient identified, Emergency Drugs available, Suction available, Patient being monitored and Timeout performed Patient Re-evaluated:Patient Re-evaluated prior to induction Oxygen Delivery Method: Circle system utilized and Simple face mask Preoxygenation: Pre-oxygenation with 100% oxygen Induction Type: IV induction Ventilation: Mask ventilation without difficulty Laryngoscope Size: Mac and 3 Grade View: Grade I Tube type: Oral Tube size: 7.0 mm Number of attempts: 1 Airway Equipment and Method: Stylet Placement Confirmation: ETT inserted through vocal cords under direct vision,  positive ETCO2 and breath sounds checked- equal and bilateral Secured at: 20 cm Tube secured with: Tape Dental Injury: Teeth and Oropharynx as per pre-operative assessment

## 2019-02-11 NOTE — Anesthesia Preprocedure Evaluation (Signed)
Anesthesia Evaluation  Patient identified by MRN, date of birth, ID band Patient awake    Reviewed: Allergy & Precautions, NPO status , Patient's Chart, lab work & pertinent test results  History of Anesthesia Complications Negative for: history of anesthetic complications  Airway Mallampati: I  TM Distance: >3 FB Neck ROM: Full    Dental no notable dental hx.    Pulmonary neg pulmonary ROS, neg sleep apnea, neg COPD,    breath sounds clear to auscultation- rhonchi (-) wheezing      Cardiovascular Exercise Tolerance: Good (-) hypertension(-) CAD and (-) Past MI  Rhythm:Regular Rate:Normal - Systolic murmurs and - Diastolic murmurs    Neuro/Psych PSYCHIATRIC DISORDERS Depression negative neurological ROS     GI/Hepatic negative GI ROS, Neg liver ROS,   Endo/Other  negative endocrine ROSneg diabetes  Renal/GU negative Renal ROS     Musculoskeletal negative musculoskeletal ROS (+)   Abdominal (+) + obese,   Peds  Hematology negative hematology ROS (+)   Anesthesia Other Findings Past Medical History: No date: Depression No date: Family history of ovarian cancer     Comment:  11/19 cancer genetic testing letter sent No date: Family history of pancreatic cancer   Reproductive/Obstetrics                             Anesthesia Physical Anesthesia Plan  ASA: II  Anesthesia Plan: General   Post-op Pain Management:    Induction: Intravenous  PONV Risk Score and Plan: 2 and Ondansetron, Dexamethasone and Midazolam  Airway Management Planned: Oral ETT  Additional Equipment:   Intra-op Plan:   Post-operative Plan: Extubation in OR  Informed Consent: I have reviewed the patients History and Physical, chart, labs and discussed the procedure including the risks, benefits and alternatives for the proposed anesthesia with the patient or authorized representative who has indicated his/her  understanding and acceptance.     Dental advisory given  Plan Discussed with: CRNA and Anesthesiologist  Anesthesia Plan Comments:         Anesthesia Quick Evaluation

## 2019-02-11 NOTE — H&P (Signed)
Date of Initial H&P: 01/28/2019  History reviewed, patient examined, no change in status, stable for surgery.  

## 2019-02-11 NOTE — Op Note (Signed)
Preoperative Diagnosis: 1) 33 y.o. with pelvic pain  Postoperative Diagnosis: 1) 33 y.o.  With pelvic pain  Operation Performed: Laparoscopic bilateral salpingectomy, left ovarian cystectomy  Indication: 33 y.o. [id number]  with chronic pelvic pain, left hydrosalpinx  Surgeon: Malachy Mood, MD  Anesthesia: General  Preoperative Antibiotics: none  Estimated Blood Loss: 10 mL  IV Fluids: 849mL  Drains or Tubes: none  Implants: none  Specimens Removed: Left tube (hydrosalpinx), right fallopian tube, left ovarian cyst  Complications: none  Intraoperative Findings: Left hydrosalpinx, right residual fimbriated fallopian tube, normal right ovary, normal left ovary with small follicular cyst, surgically absent uterus. Normal ureters, normal appendix, normal sigmoid colon, normal liver edge.  No evidence of adhesive disease or hernia.   Patient Condition: stable  Procedure in Detail:  Patient was taken to the operating room where she was administered general anesthesia.  She was positioned in the supine position, indwelling foley catheter was placed.  The patient was prepped and draped in the usual sterile fashion.  Prior to proceeding with procedure a time out was performed.    Attention was turned to the patient's abdomen.  The umbilicus was infiltrated with 1% Sensorcaine, before making a stab incision using an 11 blade scalpel.  A 73mm Excel trocar was then used to gain direct entry into the peritoneal cavity utilizing the camera to visualize progress of the trocar during placement.  Once peritoneal entry had been achieved, insufflation was started and pneumoperitoneum established at a pressure of 81mmHg. Two additional 95mm excel trocars were placed under direct visualiztion in the left lower quadrant and at Palmer's point. General inspection of the abdomen revealed the above noted findings.   The left hydrosalpinx was dissected off the left ovary, and remnant of the left round ligament  using a harmonic scalpel.  There was a small follicular 5.7SV cyst evident beneath the hydrosalpinx and this was incised, its cyst wall removed using a blunt grasper.  The ovary was otherwise normal.  Some residual fallopian tube was noted on the right ovary and this was excised using the harmonic scalpel.  The pelvis was irrigated.  The cyst was treated with Arista to ensure hemostasis.  Pneumoperitoneum was evacuated.  The trocars were removed.  All trocar sites were then dressed with surgical skin glue.  The Hulka tenaculum was removed.  Sponge needle and instrument counts were correct time two.  The patient tolerated the procedure well and was taken to the recovery room in stable condition.

## 2019-02-11 NOTE — Transfer of Care (Signed)
Immediate Anesthesia Transfer of Care Note  Patient: Sharon Andrade  Procedure(s) Performed: LAPAROSCOPIC BILATERAL SALPINGECTOMY, POSSIBLE LEFT OVARIAN CYSTECTOMY (N/A Abdomen) LAPAROSCOPY DIAGNOSTIC (N/A Abdomen)  Patient Location: PACU  Anesthesia Type:General  Level of Consciousness: sedated  Airway & Oxygen Therapy: Patient Spontanous Breathing and Patient connected to face mask oxygen  Post-op Assessment: Report given to RN and Post -op Vital signs reviewed and stable  Post vital signs: Reviewed and stable  Last Vitals:  Vitals Value Taken Time  BP    Temp    Pulse    Resp    SpO2      Last Pain:  Vitals:   02/11/19 1204  PainSc: 4          Complications: No apparent anesthesia complications

## 2019-02-11 NOTE — Anesthesia Postprocedure Evaluation (Signed)
Anesthesia Post Note  Patient: Sharon Andrade  Procedure(s) Performed: LAPAROSCOPIC BILATERAL SALPINGECTOMY, POSSIBLE LEFT OVARIAN CYSTECTOMY (N/A Abdomen) LAPAROSCOPY DIAGNOSTIC (N/A Abdomen)  Patient location during evaluation: PACU Anesthesia Type: General Level of consciousness: awake and alert Pain management: pain level controlled Vital Signs Assessment: post-procedure vital signs reviewed and stable Respiratory status: spontaneous breathing and respiratory function stable Cardiovascular status: stable Anesthetic complications: no     Last Vitals:  Vitals:   02/11/19 1535 02/11/19 1544  BP:  (!) 126/57  Pulse: 92 87  Resp: 18 17  Temp:    SpO2: 100% 100%    Last Pain:  Vitals:   02/11/19 1544  PainSc: 0-No pain                 KEPHART,WILLIAM K

## 2019-02-11 NOTE — Discharge Instructions (Signed)
AMBULATORY SURGERY  °DISCHARGE INSTRUCTIONS ° ° °1) The drugs that you were given will stay in your system until tomorrow so for the next 24 hours you should not: ° °A) Drive an automobile °B) Make any legal decisions °C) Drink any alcoholic beverage ° ° °2) You may resume regular meals tomorrow.  Today it is better to start with liquids and gradually work up to solid foods. ° °You may eat anything you prefer, but it is better to start with liquids, then soup and crackers, and gradually work up to solid foods. ° ° °3) Please notify your doctor immediately if you have any unusual bleeding, trouble breathing, redness and pain at the surgery site, drainage, fever, or pain not relieved by medication. ° ° ° °4) Additional Instructions: ° ° ° ° ° ° ° °Please contact your physician with any problems or Same Day Surgery at [phone], Monday through Friday 6 am to 4 pm, or [facility] Main number at [phone].AMBULATORY SURGERY  °DISCHARGE INSTRUCTIONS ° ° °5) The drugs that you were given will stay in your system until tomorrow so for the next 24 hours you should not: ° °D) Drive an automobile °E) Make any legal decisions °F) Drink any alcoholic beverage ° ° °6) You may resume regular meals tomorrow.  Today it is better to start with liquids and gradually work up to solid foods. ° °You may eat anything you prefer, but it is better to start with liquids, then soup and crackers, and gradually work up to solid foods. ° ° °7) Please notify your doctor immediately if you have any unusual bleeding, trouble breathing, redness and pain at the surgery site, drainage, fever, or pain not relieved by medication. ° ° ° °8) Additional Instructions: ° ° ° ° ° ° ° °Please contact your physician with any problems or Same Day Surgery at [phone], Monday through Friday 6 am to 4 pm, or [place] [facility] Main number at [phone]. °

## 2019-02-12 LAB — ABO/RH: ABO/RH(D): B POS

## 2019-02-13 ENCOUNTER — Encounter: Payer: Self-pay | Admitting: Family Medicine

## 2019-02-13 ENCOUNTER — Other Ambulatory Visit: Payer: Self-pay | Admitting: Family Medicine

## 2019-02-13 ENCOUNTER — Telehealth: Payer: Self-pay | Admitting: Gastroenterology

## 2019-02-13 DIAGNOSIS — Z1231 Encounter for screening mammogram for malignant neoplasm of breast: Secondary | ICD-10-CM

## 2019-02-13 DIAGNOSIS — N6452 Nipple discharge: Secondary | ICD-10-CM

## 2019-02-13 LAB — SURGICAL PATHOLOGY

## 2019-02-13 NOTE — Telephone Encounter (Signed)
Left vm to move up apt from 04/29/19 to 4 weeks from now

## 2019-02-13 NOTE — Telephone Encounter (Signed)
-----   Message from Debbie Ridgeway, LPN sent at 01/27/2019 12:54 PM EDT ----- °Regarding: appt. °Tatjana, ° °Please move pt's appt up as stated below. Her procedure was canceled by Dr. Stabler. ° °Thank you! °Debbie °----- Message ----- °From: Tahiliani, Varnita B, MD °Sent: 01/24/2019   9:13 AM EDT °To: Debbie Ridgeway, LPN, Michelle S Ortega, CMA ° °Lets cancel her 7/2 procedures with us altogether. She is getting Gyn workup and will have to await and see what they do before rescheduling her procedures. Have her clinic appt moved up with me to 4 weeks instead of in September.  ° ° °

## 2019-02-17 ENCOUNTER — Ambulatory Visit
Admission: RE | Admit: 2019-02-17 | Discharge: 2019-02-17 | Disposition: A | Discharge status: Home / Self Care | Payer: 59 | Source: Ambulatory Visit | Attending: Family Medicine | Admitting: Family Medicine

## 2019-02-17 ENCOUNTER — Encounter: Payer: Self-pay | Admitting: Family Medicine

## 2019-02-17 DIAGNOSIS — N6452 Nipple discharge: Secondary | ICD-10-CM | POA: Insufficient documentation

## 2019-02-17 DIAGNOSIS — Z1231 Encounter for screening mammogram for malignant neoplasm of breast: Secondary | ICD-10-CM | POA: Diagnosis not present

## 2019-02-17 DIAGNOSIS — N6323 Unspecified lump in the left breast, lower outer quadrant: Secondary | ICD-10-CM | POA: Diagnosis not present

## 2019-02-17 DIAGNOSIS — R922 Inconclusive mammogram: Secondary | ICD-10-CM | POA: Diagnosis not present

## 2019-02-17 DIAGNOSIS — N6321 Unspecified lump in the left breast, upper outer quadrant: Secondary | ICD-10-CM | POA: Diagnosis not present

## 2019-02-18 ENCOUNTER — Other Ambulatory Visit: Payer: Self-pay | Admitting: Family Medicine

## 2019-02-18 ENCOUNTER — Ambulatory Visit (INDEPENDENT_AMBULATORY_CARE_PROVIDER_SITE_OTHER): Payer: 59 | Admitting: Obstetrics and Gynecology

## 2019-02-18 ENCOUNTER — Other Ambulatory Visit: Payer: Self-pay

## 2019-02-18 ENCOUNTER — Encounter: Payer: Self-pay | Admitting: Obstetrics and Gynecology

## 2019-02-18 VITALS — BP 130/68 | HR 90 | Wt 216.0 lb

## 2019-02-18 DIAGNOSIS — R928 Other abnormal and inconclusive findings on diagnostic imaging of breast: Secondary | ICD-10-CM

## 2019-02-18 DIAGNOSIS — N6320 Unspecified lump in the left breast, unspecified quadrant: Secondary | ICD-10-CM

## 2019-02-18 DIAGNOSIS — Z4889 Encounter for other specified surgical aftercare: Secondary | ICD-10-CM

## 2019-02-18 NOTE — Progress Notes (Signed)
Postoperative Follow-up Patient presents post op from laparoscopic bilateral salpingectomy, right ovarian cystectomy 1weeks ago for adnexal mass.  Subjective: Patient reports marked improvement in her preop symptoms. Eating a regular diet without difficulty. Pain is controlled without any medications.  Activity: normal activities of daily living.  Objective: Blood pressure 130/68, pulse 90, weight 216 lb (98 kg).  General: NAD Pulmonary: no increased work of breathing Abdomen: soft, non-tender, non-distended, incision(s) D/C/I Extremities: no edema Neurologic: normal gait    Admission on 02/11/2019, Discharged on 02/11/2019  Component Date Value Ref Range Status  . ABO/RH(D) 02/11/2019    Final                   Value:B POS Performed at [facility], [address] [place], [place] [address]   . SURGICAL PATHOLOGY 02/11/2019    Final                   Value:Surgical Pathology CASE: [id number] PATIENT: Sharon Andrade Surgical Pathology Report     SPECIMEN SUBMITTED: A. Fallopian tube segment, left B. Ovarian cyst wall, left C. Fallopian tube segment, right  CLINICAL HISTORY: None provided  PRE-OPERATIVE DIAGNOSIS: Pelvic pain, left hydrosalpinx  POST-OPERATIVE DIAGNOSIS: Same as pre-op    DIAGNOSIS: A. FALLOPIAN TUBE SEGMENT, LEFT; SALPINGECTOMY: - FRAGMENTS OF BENIGN FALLOPIAN TUBE WITH NO SIGNIFICANT HISTOPATHOLOGIC CHANGE. - NEGATIVE FOR ATYPIA AND MALIGNANCY.  B. OVARY, LEFT; CYSTECTOMY: - PORTIONS OF BENIGN CYST WALL, COMPATIBLE WITH HEMORRHAGIC AND CYSTIC FOLLICLE. - NEGATIVE FOR ATYPIA AND MALIGNANCY.  C. FALLOPIAN TUBE SEGMENT, RIGHT; SALPINGECTOMY: - BENIGN PARATUBAL CYSTS; OTHERWISE NO SIGNIFICANT HISTOPATHOLOGIC CHANGE. - FRAGMENTS OF HEMORRHAGIC AND CYSTIC OVARIAN FOLLICLE. - NEGATIVE FOR ATYPIA AND MALIGNANCY.  GROSS DESCRIPTION: A. Labeled: Left fallopian tube segment Received:                          Formalin  Tissue fragment(s): 1 Size: 3.0 x 0.8 x 0.5 cm Description: Received is an irregular, markedly disrupted fragment of tan-pink soft tissue (suspicious for markedly disrupted portion of fallopian tube).  Minimal, possible fimbria are identified.  No distinct abnormalities are grossly identified.  The specimen is sectioned and entirely submitted in cassettes 1-2, with possible fimbria (intact) in cassette 1.  B. Labeled: Left ovarian cyst wall Received: Formalin Tissue fragment(s): 3 Size: Aggregate, 1.3 x 1.2 x 0.3 cm Description: Received are irregular fragments of tan-brown soft tissue. No papillary excrescences or abnormalities are grossly identified.  No normal ovarian parenchyma is grossly identified.  Sectioned. Entirely submitted in cassette 1.  C. Labeled: Right fallopian tube segment Received: Formalin Tissue fragment(s): Multiple Size: Aggregate, 3.8 x 1.2 x 0.5 cm Description: Received are irregular, markedly disrupted fragments of tan-purple                          soft tissue (suspicious for markedly disrupted portion of fallopian tube).  Possible fimbria are identified.  Sectioning slightly intact portions of fallopian tube displays a pinpoint, grossly unremarkable lumen.  Loosely attached to the external surface are 2 smooth walled, paratubal cysts measuring 0.5 and 0.7 cm in greatest dimension.  No additional abnormalities are grossly identified.  The specimen is submitted entirely as follows: 1 - entire, sectioned possible fimbria 2 - intact, paratubal cysts 3-4 - disrupted fallopian tube cross-sections     Final Diagnosis performed by Allena Napoleon, MD.   Electronically signed 02/13/2019 2:45:39PM  The electronic signature indicates that the named Attending Pathologist has evaluated the specimen  Technical component performed at [facility], [address], [place], [place] [address] Lab: [phone] Dir: Jolene SchimkeSanjai Nagendra, MD, MMM  Professional component performed  at [facility], [facility], [address],                          [place], [place] [address] Lab: [phone] Dir: Georgiann Cockerara C. Rubinas, MD     Assessment: 33 y.o. s/p laparoscopic bilateral salpingectomy, left ovarian cystectomy stable  Plan: Patient has done well after surgery with no apparent complications.  I have discussed the post-operative course to date, and the expected progress moving forward.  The patient understands what complications to be concerned about.  I will see the patient in routine follow up, or sooner if needed.    Activity plan: No lifting   Vena AustriaAndreas Geovani Tootle, MD, Merlinda FrederickFACOG [facility], [facility] 02/18/2019, 10:21 AM

## 2019-02-28 ENCOUNTER — Encounter: Payer: Self-pay | Admitting: Family Medicine

## 2019-03-11 ENCOUNTER — Ambulatory Visit: Payer: 59 | Admitting: Gastroenterology

## 2019-03-11 ENCOUNTER — Encounter: Payer: Self-pay | Admitting: *Deleted

## 2019-03-14 ENCOUNTER — Encounter: Payer: Self-pay | Admitting: Family Medicine

## 2019-03-14 ENCOUNTER — Other Ambulatory Visit: Payer: Self-pay

## 2019-03-14 ENCOUNTER — Ambulatory Visit: Payer: 59 | Admitting: Family Medicine

## 2019-03-14 VITALS — BP 126/72 | HR 92 | Temp 97.3°F | Resp 16 | Ht 67.0 in | Wt 218.5 lb

## 2019-03-14 DIAGNOSIS — E6609 Other obesity due to excess calories: Secondary | ICD-10-CM | POA: Diagnosis not present

## 2019-03-14 DIAGNOSIS — K921 Melena: Secondary | ICD-10-CM

## 2019-03-14 DIAGNOSIS — N939 Abnormal uterine and vaginal bleeding, unspecified: Secondary | ICD-10-CM | POA: Diagnosis not present

## 2019-03-14 DIAGNOSIS — K5901 Slow transit constipation: Secondary | ICD-10-CM | POA: Diagnosis not present

## 2019-03-14 DIAGNOSIS — F39 Unspecified mood [affective] disorder: Secondary | ICD-10-CM

## 2019-03-14 DIAGNOSIS — T1490XA Injury, unspecified, initial encounter: Secondary | ICD-10-CM

## 2019-03-14 DIAGNOSIS — Z6834 Body mass index (BMI) 34.0-34.9, adult: Secondary | ICD-10-CM

## 2019-03-14 DIAGNOSIS — Z8639 Personal history of other endocrine, nutritional and metabolic disease: Secondary | ICD-10-CM

## 2019-03-14 MED ORDER — NOVOFINE 32G X 6 MM MISC: 1.0000 | Freq: Every day | 3 refills | Status: AC

## 2019-03-14 MED ORDER — SAXENDA 18 MG/3ML ~~LOC~~ SOPN: 1.2000 mg | PEN_INJECTOR | Freq: Every day | SUBCUTANEOUS | 3 refills | Status: AC

## 2019-03-14 NOTE — Progress Notes (Signed)
Name: Sharon Andrade   MRN: [id number]    DOB: 02/09/1986   Date:03/14/2019       Progress Note  Subjective  Chief Complaint  Chief Complaint  Patient presents with  . Follow-up    HPI  Obesity: She is finally able to exercise - she notes losing some weight; walking 2-3 miles every day.  She started back on Saxenda after her surgery and this has been going well.  She would like refill of this today.  She is doing Keto but with fruit for a small amount of carbs; cut out sodas and sweets.   S/P GYN surgery on 02/11/2019 : Dr. Bonney AidStaebler removed bilateral segment of fallopian tub, ovarian cyst, and per patient's report "a lot" of scar tissue.  She is doing really well and is having no abdominal pain, bowels have returned to normal, no blood in stool or constipation, finally able to exercise again.  Sleep Disturbance/Childhood Trauma: She has too much energy at night; going to bed around 1-2am and waking up at 5-6am and go about her day and not be tired.  She does take Melatonin and this seems to help some nights.  She has never been diagnosed with ADHD or bipolar but states she is concerned she may have bipolar disorder.  She found her grandfather after he shot himself when she was 3yo.  Her mother gave her up at age 89, father put her into foster care at age 89.  She has history of suicide attempt as a child/adolescent. Denies SI/HI. On Wellbutrin and this is working okay for her.  MDQ is positive.   Patient Active Problem List   Diagnosis Date Noted  . Constipation by delayed colonic transit 01/08/2019  . Blood in stool 01/08/2019  . Abnormal vaginal bleeding 06/06/2018  . Trauma in childhood 06/06/2018    Past Surgical History:  Procedure Laterality Date  . ABDOMINAL HYSTERECTOMY    . CHOLECYSTECTOMY    . COMBINED HYSTEROSCOPY DIAGNOSTIC / D&C     Several d/t multiple miscarriages  . LAPAROSCOPIC BILATERAL SALPINGECTOMY N/A 02/11/2019   Procedure: LAPAROSCOPIC BILATERAL  SALPINGECTOMY, POSSIBLE LEFT OVARIAN CYSTECTOMY;  Surgeon: Vena AustriaStaebler, Andreas, MD;  Location: [facility] ORS;  Service: Gynecology;  Laterality: N/A;  . LAPAROSCOPY N/A 02/11/2019   Procedure: LAPAROSCOPY DIAGNOSTIC;  Surgeon: Vena AustriaStaebler, Andreas, MD;  Location: [facility] ORS;  Service: Gynecology;  Laterality: N/A;  . TUBAL LIGATION      Family History  Problem Relation Age of Onset  . COPD Mother        smoker  . Ovarian cancer Mother   . Emphysema Father        smoker  . COPD Maternal Grandmother        smoker  . Colon cancer Maternal Grandfather   . Depression Paternal Grandfather        Shot himself; pt found him when she was 33yo  . Breast cancer Maternal Aunt 30  . Ovarian cancer Maternal Aunt   . Pancreatic cancer Maternal Uncle     Social History   Socioeconomic History  . Marital status: Married    Spouse name: Caryn BeeKevin  . Number of children: 2  . Years of education: Not on file  . Highest education level: Not on file  Occupational History  . Not on file  Social Needs  . Financial resource strain: Not hard at all  . Food insecurity    Worry: Never true    Inability: Never true  . Transportation needs  Medical: No    Non-medical: No  Tobacco Use  . Smoking status: Never Smoker  . Smokeless tobacco: Never Used  Substance and Sexual Activity  . Alcohol use: Never    Frequency: Never  . Drug use: Never  . Sexual activity: Yes    Partners: Male    Birth control/protection: Surgical    Comment: Hysterectomy  Lifestyle  . Physical activity    Days per week: 3 days    Minutes per session: 30 min  . Stress: Not at all  Relationships  . Social connections    Talks on phone: More than three times a week    Gets together: More than three times a week    Attends religious service: Never    Active member of club or organization: No    Attends meetings of clubs or organizations: Never    Relationship status: Married  . Intimate partner violence    Fear of current or ex  partner: No    Emotionally abused: No    Physically abused: No    Forced sexual activity: No  Other Topics Concern  . Not on file  Social History Narrative   33yo and 33yo; married to her husband     Current Outpatient Medications:  .  albuterol (PROVENTIL HFA;VENTOLIN HFA) 108 (90 Base) MCG/ACT inhaler, Inhale into the lungs., Disp: , Rfl:  .  buPROPion (WELLBUTRIN XL) 150 MG 24 hr tablet, Take 1 tablet (150 mg total) by mouth daily., Disp: 30 tablet, Rfl: 0 .  busPIRone (BUSPAR) 7.5 MG tablet, TAKE 1 TABLET BY MOUTH NIGHTLY. AFTER 7 DAYS, MAY INCREASE TO 1 TABLET BY MOUTH EVERY 8 HOURS AS NEEDED FOR ANXIETY (MAX 3 TABLETS PER DAY), Disp: 60 tablet, Rfl: 0 .  ibuprofen (ADVIL) 600 MG tablet, Take 1 tablet (600 mg total) by mouth every 6 (six) hours as needed., Disp: 30 tablet, Rfl: 0 .  Multiple Vitamin (MULTIVITAMIN) tablet, Take 1 tablet by mouth daily., Disp: , Rfl:  .  ondansetron (ZOFRAN) 4 MG tablet, Take 1 tablet (4 mg total) by mouth every 8 (eight) hours as needed for nausea or vomiting., Disp: 20 tablet, Rfl: 0 .  oxyCODONE-acetaminophen (PERCOCET) 5-325 MG tablet, Take 1 tablet by mouth every 4 (four) hours as needed for severe pain., Disp: 30 tablet, Rfl: 0 .  metoCLOPramide (REGLAN) 10 MG tablet, Take 1 tablet (10 mg total) by mouth every 8 (eight) hours as needed for up to 3 days for nausea. (Patient not taking: Reported on 02/11/2019), Disp: 20 tablet, Rfl: 0 .  omeprazole (PRILOSEC) 20 MG capsule, Take 1 capsule (20 mg total) by mouth daily for 30 days., Disp: 30 capsule, Rfl: 0 .  polyethylene glycol (MIRALAX) 17 g packet, Use twice daily until stools are regular, then once daily. (Patient not taking: Reported on 02/11/2019), Disp: 100 each, Rfl: 1  Allergies  Allergen Reactions  . Penicillins Rash and Other (See Comments)    Did it involve swelling of the face/tongue/throat, SOB, or low BP? No Did it involve sudden or severe rash/hives, skin peeling, or any reaction on the  inside of your mouth or nose? Yes Did you need to seek medical attention at a hospital or doctor's office? No When did it last happen? Baby If all above answers are "NO", may proceed with cephalosporin use.   . Tramadol Palpitations    I personally reviewed active problem list, medication list, allergies, notes from last encounter, lab results with the patient/caregiver today.  ROS  Constitutional: Negative for fever or weight change.  Respiratory: Negative for cough and shortness of breath.   Cardiovascular: Negative for chest pain or palpitations.  Gastrointestinal: Negative for abdominal pain, no bowel changes.  Musculoskeletal: Negative for gait problem or joint swelling.  Skin: Negative for rash.  Neurological: Negative for dizziness or headache.  No other specific complaints in a complete review of systems (except as listed in HPI above).  Objective  Vitals:   03/14/19 0744  BP: 126/72  Pulse: 92  Resp: 16  Temp: (!) 97.3 F (36.3 C)  TempSrc: Temporal  SpO2: 95%  Weight: 218 lb 8 oz (99.1 kg)  Height: 5\' 7"  (1.702 m)   Body mass index is 34.22 kg/m.  Physical Exam Constitutional: Patient appears well-developed and well-nourished. No distress.  HENT: Head: Normocephalic and atraumatic. Eyes: Conjunctivae and EOM are normal. No scleral icterus. Neck: Normal range of motion. Neck supple. No JVD present. No thyromegaly present.  Cardiovascular: Normal rate, regular rhythm and normal heart sounds.  No murmur heard. No BLE edema. Pulmonary/Chest: Effort normal and breath sounds normal. No respiratory distress. Musculoskeletal: Normal range of motion, no joint effusions. No gross deformities Neurological: Pt is alert and oriented to person, place, and time. No cranial nerve deficit. Coordination, balance, strength, speech and gait are normal.  Skin: Skin is warm and dry. No rash noted. No erythema.  Psychiatric: Patient has a normal mood and affect. behavior is  normal. Judgment and thought content normal.  No results found for this or any previous visit (from the past 72 hour(s)).  PHQ2/9: Depression screen [facility] 2/9 03/14/2019 02/07/2019 01/08/2019 10/03/2018 08/02/2018  Decreased Interest 0 2 0 3 2  Down, Depressed, Hopeless 1 2 0 2 3  PHQ - 2 Score 1 4 0 5 5  Altered sleeping 2 2 0 2 3  Tired, decreased energy 0 3 0 2 2  Change in appetite 0 2 0 2 2  Feeling bad or failure about yourself  1 3 0 3 3  Trouble concentrating 0 0 0 0 2  Moving slowly or fidgety/restless 0 0 0 0 0  Suicidal thoughts 0 0 0 0 0  PHQ-9 Score 4 14 0 14 17  Difficult doing work/chores Not difficult at all Somewhat difficult Not difficult at all Somewhat difficult Very difficult   PHQ-2/9 Result is positive.    Fall Risk: Fall Risk  03/14/2019 02/07/2019 01/08/2019 10/03/2018 08/02/2018  Falls in the past year? 0 0 0 0 0  Number falls in past yr: 0 0 0 0 0  Injury with Fall? 0 0 0 0 0  Follow up Falls evaluation completed - Falls evaluation completed - -   Assessment & Plan  1. Class 1 obesity due to excess calories without serious comorbidity with body mass index (BMI) of 34.0 to 34.9 in adult - Discussed importance of 150 minutes of physical activity weekly, eat two servings of fish weekly, eat one serving of tree nuts ( cashews, pistachios, pecans, almonds.Marland Kitchen) every other day, eat 6 servings of fruit/vegetables daily and drink plenty of water and avoid sweet beverages.  - Liraglutide -Weight Management (SAXENDA) 18 MG/3ML SOPN; Inject 1.2 mg into the skin daily.  Dispense: 9 mL; Refill: 3 - Insulin Pen Needle (NOVOFINE) 32G X 6 MM MISC; 1 each by Does not apply route daily.  Dispense: 100 each; Refill: 3  2. Trauma in childhood - Ambulatory referral to Psychiatry  3. Constipation by delayed colonic transit - Doing  well, stable after surgery  4. Blood in stool - Resolved after surgery  5. Abnormal vaginal bleeding - Resolved after surgery  6. Mood disorder (HCC) -  Ambulatory referral to Psychiatry - Discussed Wellbutrin as possible contributing factor in her lack of sleep, but she has been doing well on it and wants to remain on for now; the combination of Wellbutrin and Seroquel can increase risk of seizure, so we will await psychiatry referral to get medication change recommendations.

## 2019-03-25 ENCOUNTER — Other Ambulatory Visit: Payer: Self-pay

## 2019-03-25 ENCOUNTER — Ambulatory Visit: Payer: 59 | Admitting: Obstetrics and Gynecology

## 2019-03-25 ENCOUNTER — Encounter: Payer: Self-pay | Admitting: Obstetrics and Gynecology

## 2019-03-25 ENCOUNTER — Ambulatory Visit (INDEPENDENT_AMBULATORY_CARE_PROVIDER_SITE_OTHER): Payer: 59 | Admitting: Obstetrics and Gynecology

## 2019-03-25 VITALS — BP 128/70 | HR 96 | Ht 67.0 in | Wt 215.0 lb

## 2019-03-25 DIAGNOSIS — Z4889 Encounter for other specified surgical aftercare: Secondary | ICD-10-CM

## 2019-03-25 NOTE — Progress Notes (Signed)
Postoperative Follow-up Patient presents post op from laparoscopic left ovarian cystectomy, bilateral salpingectomy 6weeks ago for pelvic pain.  Subjective: Patient reports marked improvement in her preop symptoms. Eating a regular diet without difficulty. The patient is not having any pain.  Activity: normal activities of daily living.  Did have a episode of pelvic pain yesterday first time, unsure if related to increased activity at work.  Objective: Blood pressure 128/70, pulse 96, height 5\' 7"  (1.702 m), weight 215 lb (97.5 kg).  General: NAD Pulmonary: no increased work of breathing Abdomen: soft, non-tender, non-distended, incision(s) D/C/I GU: normal external female genitalia vaginal cuff intact, well healed Extremities: no edema Neurologic: normal gait    Admission on 02/11/2019, Discharged on 02/11/2019  Component Date Value Ref Range Status  . ABO/RH(D) 02/11/2019    Final                   Value:B POS Performed at [facility], [address] [place], [place] [address]   . SURGICAL PATHOLOGY 02/11/2019    Final                   Value:Surgical Pathology CASE: [id number] PATIENT: Sharon Andrade Surgical Pathology Report     SPECIMEN SUBMITTED: A. Fallopian tube segment, left B. Ovarian cyst wall, left C. Fallopian tube segment, right  CLINICAL HISTORY: None provided  PRE-OPERATIVE DIAGNOSIS: Pelvic pain, left hydrosalpinx  POST-OPERATIVE DIAGNOSIS: Same as pre-op    DIAGNOSIS: A. FALLOPIAN TUBE SEGMENT, LEFT; SALPINGECTOMY: - FRAGMENTS OF BENIGN FALLOPIAN TUBE WITH NO SIGNIFICANT HISTOPATHOLOGIC CHANGE. - NEGATIVE FOR ATYPIA AND MALIGNANCY.  B. OVARY, LEFT; CYSTECTOMY: - PORTIONS OF BENIGN CYST WALL, COMPATIBLE WITH HEMORRHAGIC AND CYSTIC FOLLICLE. - NEGATIVE FOR ATYPIA AND MALIGNANCY.  C. FALLOPIAN TUBE SEGMENT, RIGHT; SALPINGECTOMY: - BENIGN PARATUBAL CYSTS; OTHERWISE NO SIGNIFICANT HISTOPATHOLOGIC CHANGE. -  FRAGMENTS OF HEMORRHAGIC AND CYSTIC OVARIAN FOLLICLE. - NEGATIVE FOR ATYPIA AND MALIGNANCY.  GROSS DESCRIPTION: A. Labeled: Left fallopian tube segment Received:                          Formalin Tissue fragment(s): 1 Size: 3.0 x 0.8 x 0.5 cm Description: Received is an irregular, markedly disrupted fragment of tan-pink soft tissue (suspicious for markedly disrupted portion of fallopian tube).  Minimal, possible fimbria are identified.  No distinct abnormalities are grossly identified.  The specimen is sectioned and entirely submitted in cassettes 1-2, with possible fimbria (intact) in cassette 1.  B. Labeled: Left ovarian cyst wall Received: Formalin Tissue fragment(s): 3 Size: Aggregate, 1.3 x 1.2 x 0.3 cm Description: Received are irregular fragments of tan-brown soft tissue. No papillary excrescences or abnormalities are grossly identified.  No normal ovarian parenchyma is grossly identified.  Sectioned. Entirely submitted in cassette 1.  C. Labeled: Right fallopian tube segment Received: Formalin Tissue fragment(s): Multiple Size: Aggregate, 3.8 x 1.2 x 0.5 cm Description: Received are irregular, markedly disrupted fragments of tan-purple                          soft tissue (suspicious for markedly disrupted portion of fallopian tube).  Possible fimbria are identified.  Sectioning slightly intact portions of fallopian tube displays a pinpoint, grossly unremarkable lumen.  Loosely attached to the external surface are 2 smooth walled, paratubal cysts measuring 0.5 and 0.7 cm in greatest dimension.  No additional abnormalities are grossly identified.  The specimen is submitted entirely as  follows: 1 - entire, sectioned possible fimbria 2 - intact, paratubal cysts 3-4 - disrupted fallopian tube cross-sections     Final Diagnosis performed by Katherine MantleHeath Jones, MD.   Electronically signed 02/13/2019 2:45:39PM The electronic signature indicates that the named Attending  Pathologist has evaluated the specimen  Technical component performed at [facility], [address], [place], [place] [address] Lab: [phone] Dir: Jolene SchimkeSanjai Nagendra, MD, MMM  Professional component performed at [facility], [facility], [address],                          [place], [place] [address] Lab: [phone] Dir: Georgiann Cockerara C. Rubinas, MD     Assessment: 33 y.o. s/p laparoscopic left ovarian cystectomy, bilateral salpingectomy stable  Plan: Patient has done well after surgery with no apparent complications.  I have discussed the post-operative course to date, and the expected progress moving forward.  The patient understands what complications to be concerned about.  I will see the patient in routine follow up, or sooner if needed.    Activity plan: No restriction.   Vena AustriaAndreas Sahid Borba, MD, Merlinda FrederickFACOG [facility], [facility] 03/25/2019, 4:08 PM

## 2019-04-14 ENCOUNTER — Other Ambulatory Visit: Payer: Self-pay

## 2019-04-14 ENCOUNTER — Encounter: Payer: Self-pay | Admitting: Family Medicine

## 2019-04-14 ENCOUNTER — Ambulatory Visit (INDEPENDENT_AMBULATORY_CARE_PROVIDER_SITE_OTHER): Payer: 59 | Admitting: Family Medicine

## 2019-04-14 DIAGNOSIS — Z20822 Contact with and (suspected) exposure to covid-19: Secondary | ICD-10-CM

## 2019-04-14 DIAGNOSIS — Z20828 Contact with and (suspected) exposure to other viral communicable diseases: Secondary | ICD-10-CM | POA: Diagnosis not present

## 2019-04-14 DIAGNOSIS — R6889 Other general symptoms and signs: Secondary | ICD-10-CM | POA: Diagnosis not present

## 2019-04-14 DIAGNOSIS — A084 Viral intestinal infection, unspecified: Secondary | ICD-10-CM | POA: Diagnosis not present

## 2019-04-14 NOTE — Progress Notes (Signed)
Name: Sharon Andrade Kolodziej   MRN: [id number]    DOB: 02/25/1986   Date:04/14/2019       Progress Note  Subjective  Chief Complaint  Chief Complaint  Patient presents with  . Fatigue  . Diarrhea    nausea,    I connected with  Sharon Andrade Cowing  on 04/14/19 at  9:20 AM EDT by a video enabled telemedicine application and verified that I am speaking with the correct person using two identifiers.  I discussed the limitations of evaluation and management by telemedicine and the availability of in person appointments. The patient expressed understanding and agreed to proceed. Staff also discussed with the patient that there may be a patient responsible charge related to this service. Patient Location: [employer] (Parked) Provider Location: Office Additional Individuals present: None  HPI  Pt presents with concern for fatigue, diarrhea, and nausea that started yesterday.  She notes stool was yellow yesterday.  Denies fevers/chills, body aches, abdominal pain.  She is having decreased appetite, but is tolerating PO fluids and some foods.  She does have zofran at home, advised may take this PRN as directed.   She is having COVID testing weekly; last test was last week and was negative.  Was supposed to have it done today but she is not feeling well and called out.  She works [place] [employer] and there has been a fairly recent COVID outbreak, but she has no known exposure to any positive patients or workers without proper PPE. Temp today was 98.50F.  Patient Active Problem List   Diagnosis Date Noted  . Constipation by delayed colonic transit 01/08/2019  . Blood in stool 01/08/2019  . Abnormal vaginal bleeding 06/06/2018  . Trauma in childhood 06/06/2018    Past Surgical History:  Procedure Laterality Date  . ABDOMINAL HYSTERECTOMY    . CHOLECYSTECTOMY    . COMBINED HYSTEROSCOPY DIAGNOSTIC / D&C     Several d/t multiple miscarriages  . LAPAROSCOPIC BILATERAL SALPINGECTOMY N/A  02/11/2019   Procedure: LAPAROSCOPIC BILATERAL SALPINGECTOMY, POSSIBLE LEFT OVARIAN CYSTECTOMY;  Surgeon: Vena AustriaStaebler, Andreas, MD;  Location: [facility] ORS;  Service: Gynecology;  Laterality: N/A;  . LAPAROSCOPY N/A 02/11/2019   Procedure: LAPAROSCOPY DIAGNOSTIC;  Surgeon: Vena AustriaStaebler, Andreas, MD;  Location: [facility] ORS;  Service: Gynecology;  Laterality: N/A;  . TUBAL LIGATION      Family History  Problem Relation Age of Onset  . COPD Mother        smoker  . Ovarian cancer Mother   . Emphysema Father        smoker  . COPD Maternal Grandmother        smoker  . Colon cancer Maternal Grandfather   . Depression Paternal Grandfather        Shot himself; pt found him when she was 33yo  . Breast cancer Maternal Aunt 30  . Ovarian cancer Maternal Aunt   . Pancreatic cancer Maternal Uncle     Social History   Socioeconomic History  . Marital status: Married    Spouse name: Caryn BeeKevin  . Number of children: 2  . Years of education: Not on file  . Highest education level: Not on file  Occupational History  . Not on file  Social Needs  . Financial resource strain: Not hard at all  . Food insecurity    Worry: Never true    Inability: Never true  . Transportation needs    Medical: No    Non-medical: No  Tobacco Use  .  Smoking status: Never Smoker  . Smokeless tobacco: Never Used  Substance and Sexual Activity  . Alcohol use: Never    Frequency: Never  . Drug use: Never  . Sexual activity: Yes    Partners: Male    Birth control/protection: Surgical    Comment: Hysterectomy  Lifestyle  . Physical activity    Days per week: 3 days    Minutes per session: 30 min  . Stress: Not at all  Relationships  . Social connections    Talks on phone: More than three times a week    Gets together: More than three times a week    Attends religious service: Never    Active member of club or organization: No    Attends meetings of clubs or organizations: Never    Relationship status: Married  . Intimate  partner violence    Fear of current or ex partner: No    Emotionally abused: No    Physically abused: No    Forced sexual activity: No  Other Topics Concern  . Not on file  Social History Narrative   33yo and 33yo; married to her husband     Current Outpatient Medications:  .  albuterol (PROVENTIL HFA;VENTOLIN HFA) 108 (90 Base) MCG/ACT inhaler, Inhale into the lungs., Disp: , Rfl:  .  buPROPion (WELLBUTRIN XL) 150 MG 24 hr tablet, Take 1 tablet (150 mg total) by mouth daily., Disp: 30 tablet, Rfl: 0 .  busPIRone (BUSPAR) 7.5 MG tablet, TAKE 1 TABLET BY MOUTH NIGHTLY. AFTER 7 DAYS, MAY INCREASE TO 1 TABLET BY MOUTH EVERY 8 HOURS AS NEEDED FOR ANXIETY (MAX 3 TABLETS PER DAY), Disp: 60 tablet, Rfl: 0 .  ibuprofen (ADVIL) 600 MG tablet, Take 1 tablet (600 mg total) by mouth every 6 (six) hours as needed., Disp: 30 tablet, Rfl: 0 .  Insulin Pen Needle (NOVOFINE) 32G X 6 MM MISC, 1 each by Does not apply route daily., Disp: 100 each, Rfl: 3 .  Liraglutide -Weight Management (SAXENDA) 18 MG/3ML SOPN, Inject 1.2 mg into the skin daily., Disp: 9 mL, Rfl: 3 .  Multiple Vitamin (MULTIVITAMIN) tablet, Take 1 tablet by mouth daily., Disp: , Rfl:  .  ondansetron (ZOFRAN) 4 MG tablet, Take 1 tablet (4 mg total) by mouth every 8 (eight) hours as needed for nausea or vomiting., Disp: 20 tablet, Rfl: 0 .  omeprazole (PRILOSEC) 20 MG capsule, Take 1 capsule (20 mg total) by mouth daily for 30 days., Disp: 30 capsule, Rfl: 0 .  oxyCODONE-acetaminophen (PERCOCET) 5-325 MG tablet, Take 1 tablet by mouth every 4 (four) hours as needed for severe pain. (Patient not taking: Reported on 03/25/2019), Disp: 30 tablet, Rfl: 0  Allergies  Allergen Reactions  . Penicillins Rash and Other (See Comments)    Did it involve swelling of the face/tongue/throat, SOB, or low BP? No Did it involve sudden or severe rash/hives, skin peeling, or any reaction on the inside of your mouth or nose? Yes Did you need to seek medical  attention at a hospital or doctor's office? No When did it last happen? Baby If all above answers are "NO", may proceed with cephalosporin use.   . Tramadol Palpitations    I personally reviewed active problem list, medication list, allergies, notes from last encounter with the patient/caregiver today.   ROS  Ten systems reviewed and is negative except as mentioned in HPI  Objective  Virtual encounter, vitals not obtained.  There is no height or weight on file  to calculate BMI.  Physical Exam  Constitutional: Patient appears well-developed and well-nourished. No distress.  HENT: Head: Normocephalic and atraumatic.  Neck: Normal range of motion. Pulmonary/Chest: Effort normal. No respiratory distress. Speaking in complete sentences Neurological: Pt is alert and oriented to person, place, and time. Coordination, speech and gait are normal.  Psychiatric: Patient has a normal mood and affect. behavior is normal. Judgment and thought content normal.   No results found for this or any previous visit (from the past 72 hour(s)).  PHQ2/9: Depression screen [facility] 2/9 04/14/2019 03/14/2019 02/07/2019 01/08/2019 10/03/2018  Decreased Interest 0 0 2 0 3  Down, Depressed, Hopeless 0 1 2 0 2  PHQ - 2 Score 0 1 4 0 5  Altered sleeping 0 2 2 0 2  Tired, decreased energy 0 0 3 0 2  Change in appetite 0 0 2 0 2  Feeling bad or failure about yourself  0 1 3 0 3  Trouble concentrating 0 0 0 0 0  Moving slowly or fidgety/restless 0 0 0 0 0  Suicidal thoughts 0 0 0 0 0  PHQ-9 Score 0 4 14 0 14  Difficult doing work/chores Not difficult at all Not difficult at all Somewhat difficult Not difficult at all Somewhat difficult   PHQ-2/9 Result is negative.    Fall Risk: Fall Risk  04/14/2019 03/14/2019 02/07/2019 01/08/2019 10/03/2018  Falls in the past year? 0 0 0 0 0  Number falls in past yr: 0 0 0 0 0  Injury with Fall? 0 0 0 0 0  Follow up Falls evaluation completed Falls evaluation completed - Falls  evaluation completed -     Assessment & Plan  1. 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  Zofran PRN which she has at home, bland diet, work note for today and tomorrow.   -Red flags and when to present for emergency care or RTC including fever >101.47F, chest pain, shortness of breath, new/worsening/un-resolving symptoms, Abdominal pain, vomiting/diarrhea that is severe reviewed with patient at time of visit. Follow up and care instructions discussed and provided in AVS.  2. Exposure to Covid-19 Virus - Novel Coronavirus, NAA ([facility])   I discussed the assessment and treatment plan with the patient. The patient was provided an opportunity to ask questions and all were answered. The patient agreed with the plan and demonstrated an understanding of the instructions.  The patient was advised to call back or seek an in-person evaluation if the symptoms worsen or if the condition fails to improve as anticipated.  I provided 10 minutes of non-face-to-face time during this encounter.

## 2019-04-15 ENCOUNTER — Encounter: Payer: Self-pay | Admitting: Family Medicine

## 2019-04-15 DIAGNOSIS — A084 Viral intestinal infection, unspecified: Secondary | ICD-10-CM | POA: Diagnosis not present

## 2019-04-15 DIAGNOSIS — R112 Nausea with vomiting, unspecified: Secondary | ICD-10-CM | POA: Diagnosis not present

## 2019-04-15 DIAGNOSIS — R197 Diarrhea, unspecified: Secondary | ICD-10-CM | POA: Diagnosis not present

## 2019-04-15 LAB — NOVEL CORONAVIRUS, NAA: SARS-CoV-2, NAA: NOT DETECTED

## 2019-04-15 NOTE — Telephone Encounter (Signed)
Patient was advised practice had no availability and patient wanted to inform PCP she will go to an urgent care.

## 2019-04-16 ENCOUNTER — Other Ambulatory Visit: Payer: Self-pay

## 2019-04-16 ENCOUNTER — Encounter: Payer: Self-pay | Admitting: Family Medicine

## 2019-04-16 ENCOUNTER — Other Ambulatory Visit
Admission: RE | Admit: 2019-04-16 | Discharge: 2019-04-16 | Disposition: A | Discharge status: Home / Self Care | Payer: 59 | Attending: Family Medicine | Admitting: Family Medicine

## 2019-04-16 ENCOUNTER — Telehealth: Payer: Self-pay

## 2019-04-16 ENCOUNTER — Ambulatory Visit (INDEPENDENT_AMBULATORY_CARE_PROVIDER_SITE_OTHER): Payer: 59 | Admitting: Family Medicine

## 2019-04-16 ENCOUNTER — Ambulatory Visit: Payer: 59 | Admitting: Family Medicine

## 2019-04-16 DIAGNOSIS — R110 Nausea: Secondary | ICD-10-CM

## 2019-04-16 DIAGNOSIS — R197 Diarrhea, unspecified: Secondary | ICD-10-CM

## 2019-04-16 LAB — COMPREHENSIVE METABOLIC PANEL
ALT: 43 U/L (ref 0–44)
AST: 29 U/L (ref 15–41)
Albumin: 4.1 g/dL (ref 3.5–5.0)
Alkaline Phosphatase: 73 U/L (ref 38–126)
Anion gap: 7 (ref 5–15)
BUN: 14 mg/dL (ref 6–20)
CO2: 23 mmol/L (ref 22–32)
Calcium: 9.1 mg/dL (ref 8.9–10.3)
Chloride: 108 mmol/L (ref 98–111)
Creatinine, Ser: 0.68 mg/dL (ref 0.44–1.00)
GFR calc Af Amer: >60 mL/min (ref >60)
GFR calc non Af Amer: >60 mL/min (ref >60)
Glucose, Bld: 105 mg/dL — ABNORMAL HIGH (ref 70–99)
Potassium: 3.6 mmol/L (ref 3.5–5.1)
Sodium: 138 mmol/L (ref 135–145)
Total Bilirubin: 0.6 mg/dL (ref 0.3–1.2)
Total Protein: 7 g/dL (ref 6.5–8.1)

## 2019-04-16 LAB — CBC WITH DIFFERENTIAL/PLATELET
Abs Immature Granulocytes: 0.03 10*3/uL (ref 0.00–0.07)
Basophils Absolute: 0.1 10*3/uL (ref 0.0–0.1)
Basophils Relative: 1 %
Eosinophils Absolute: 0.2 10*3/uL (ref 0.0–0.5)
Eosinophils Relative: 3 %
HCT: 42.5 % (ref 36.0–46.0)
Hemoglobin: 14.8 g/dL (ref 12.0–15.0)
Immature Granulocytes: 0 %
Lymphocytes Relative: 27 %
Lymphs Abs: 2 10*3/uL (ref 0.7–4.0)
MCH: 29.3 pg (ref 26.0–34.0)
MCHC: 34.8 g/dL (ref 30.0–36.0)
MCV: 84.2 fL (ref 80.0–100.0)
Monocytes Absolute: 0.6 10*3/uL (ref 0.1–1.0)
Monocytes Relative: 8 %
Neutro Abs: 4.6 10*3/uL (ref 1.7–7.7)
Neutrophils Relative %: 61 %
Platelets: 315 10*3/uL (ref 150–400)
RBC: 5.05 MIL/uL (ref 3.87–5.11)
RDW: 12.1 % (ref 11.5–15.5)
WBC: 7.5 10*3/uL (ref 4.0–10.5)
nRBC: 0 % (ref 0.0–0.2)

## 2019-04-16 NOTE — Telephone Encounter (Signed)
Patient states she has been fatigue recently. Patient states on Saturday she had bad abdominal pain. Patient states she went to the bathroom to have a bowel movement and it was straight blood. Patient states that she then has bright yellow with blood mixed in her stools since then. She did go to her PCP office and they tested her for COVID on Monday and it was negative. Made patient a appointment on 04/17/2019 at 3:15

## 2019-04-16 NOTE — Telephone Encounter (Signed)
Tried to call patient first number rang but unable to leave a message left a message on the  Other number

## 2019-04-16 NOTE — Progress Notes (Signed)
Name: Sharon EstimableJennifer R Tay   MRN: [id number]    DOB: 04/26/1986   Date:04/16/2019       Progress Note  Subjective  Chief Complaint  Chief Complaint  Patient presents with  . Fatigue  . Rectal Bleeding    I connected with  Sharon Andrade  on 04/16/19 at 11:40 AM EDT by a video enabled telemedicine application and verified that I am speaking with the correct person using two identifiers.  I discussed the limitations of evaluation and management by telemedicine and the availability of in person appointments. The patient expressed understanding and agreed to proceed. Staff also discussed with the patient that there may be a patient responsible charge related to this service. Patient Location: Home Provider Location: Home Additional Individuals present: None  HPI  Pt presents to follow up on diarrhea, fatigue, and nausea.  She notes highest temp is 99.32F at home.  She has had ongoing diarrhea for 3 days; her diarrhea is no longer just yellow - there is some brown in the stool again.  She had one episode of BRBPR yesterday - just a few drops, did not turn toilet water a different color; there were a few very tiny clots.  Her COVID testing was negative. She is eating and drinking, zofran is helping with this.  Has been eating and drinking without vomiting, though appetite is decreased.  She has had maybe 4-5 BM's in the last 24 hours.  She is s/p cholecystectomy, and hysterectomy.    She did place call to Dr. Maximino Greenlandahiliani, and states she has an appointment tomorrow at 2:45pm.  We will order labs to hopefully provide further insight prior to this appointment.   Patient Active Problem List   Diagnosis Date Noted  . Constipation by delayed colonic transit 01/08/2019  . Blood in stool 01/08/2019  . Abnormal vaginal bleeding 06/06/2018  . Trauma in childhood 06/06/2018    Social History   Tobacco Use  . Smoking status: Never Smoker  . Smokeless tobacco: Never Used  Substance Use Topics  .  Alcohol use: Never    Frequency: Never     Current Outpatient Medications:  .  albuterol (PROVENTIL HFA;VENTOLIN HFA) 108 (90 Base) MCG/ACT inhaler, Inhale into the lungs., Disp: , Rfl:  .  buPROPion (WELLBUTRIN XL) 150 MG 24 hr tablet, Take 1 tablet (150 mg total) by mouth daily., Disp: 30 tablet, Rfl: 0 .  busPIRone (BUSPAR) 7.5 MG tablet, TAKE 1 TABLET BY MOUTH NIGHTLY. AFTER 7 DAYS, MAY INCREASE TO 1 TABLET BY MOUTH EVERY 8 HOURS AS NEEDED FOR ANXIETY (MAX 3 TABLETS PER DAY), Disp: 60 tablet, Rfl: 0 .  ibuprofen (ADVIL) 600 MG tablet, Take 1 tablet (600 mg total) by mouth every 6 (six) hours as needed., Disp: 30 tablet, Rfl: 0 .  Insulin Pen Needle (NOVOFINE) 32G X 6 MM MISC, 1 each by Does not apply route daily., Disp: 100 each, Rfl: 3 .  Liraglutide -Weight Management (SAXENDA) 18 MG/3ML SOPN, Inject 1.2 mg into the skin daily., Disp: 9 mL, Rfl: 3 .  Multiple Vitamin (MULTIVITAMIN) tablet, Take 1 tablet by mouth daily., Disp: , Rfl:  .  ondansetron (ZOFRAN) 4 MG tablet, Take 1 tablet (4 mg total) by mouth every 8 (eight) hours as needed for nausea or vomiting., Disp: 20 tablet, Rfl: 0 .  omeprazole (PRILOSEC) 20 MG capsule, Take 1 capsule (20 mg total) by mouth daily for 30 days., Disp: 30 capsule, Rfl: 0 .  oxyCODONE-acetaminophen (PERCOCET) 5-325 MG  tablet, Take 1 tablet by mouth every 4 (four) hours as needed for severe pain. (Patient not taking: Reported on 03/25/2019), Disp: 30 tablet, Rfl: 0  Allergies  Allergen Reactions  . Penicillins Rash and Other (See Comments)    Did it involve swelling of the face/tongue/throat, SOB, or low BP? No Did it involve sudden or severe rash/hives, skin peeling, or any reaction on the inside of your mouth or nose? Yes Did you need to seek medical attention at a hospital or doctor's office? No When did it last happen? Baby If all above answers are "NO", may proceed with cephalosporin use.   . Tramadol Palpitations    I personally reviewed active  problem list, medication list, allergies, notes from last encounter, lab results with the patient/caregiver today.  ROS  Ten systems reviewed and is negative except as mentioned in HPI  Objective  Virtual encounter, vitals not obtained.  There is no height or weight on file to calculate BMI.  Nursing Note and Vital Signs reviewed.  Physical Exam  Constitutional: Patient appears well-developed and well-nourished. No distress.  HENT: Head: Normocephalic and atraumatic.  Neck: Normal range of motion. Pulmonary/Chest: Effort normal. No respiratory distress. Speaking in complete sentences Neurological: Pt is alert and oriented to person, place, and time. Coordination, speech and gait are normal.  Psychiatric: Patient has a normal mood and affect. behavior is normal. Judgment and thought content normal. Abdominal: she self-palpates the abdomen and endorses some mild tenderness around the umbilicus; is able to jump up and down 1-2 times without severe pain. Abdomen appears soft when patient palpates, no distension.  Results for orders placed or performed in visit on 04/14/19 (from the past 72 hour(s))  Novel Coronavirus, NAA ([facility])     Status: None   Collection Time: 04/14/19  9:58 AM   Specimen: Oropharyngeal(OP) collection in vial transport medium   OROPHARYNGEA  TESTING  Result Value Ref Range   SARS-CoV-2, NAA Not Detected Not Detected    Comment: Testing was performed using the cobas(R) SARS-CoV-2 test. This nucleic acid amplification test was developed and its performance characteristics determined by [employer]. Nucleic acid amplification tests include PCR and TMA. This test has not been FDA cleared or approved. This test has been authorized by FDA under an Emergency Use Authorization (EUA). This test is only authorized for the duration of time the declaration that circumstances exist justifying the authorization of the emergency use of in vitro diagnostic tests  for detection of SARS-CoV-2 virus and/or diagnosis of COVID-19 infection under section 564(b)(1) of the Act, 21 U.S.C. 725DGU-4(Q) (1), unless the authorization is terminated or revoked sooner. When diagnostic testing is negative, the possibility of a false negative result should be considered in the context of a patient's recent exposures and the presence of clinical signs and symptoms consistent with COVID-19. An individual without symptoms  of COVID-19 and who is not shedding SARS-CoV-2 virus would expect to have a negative (not detected) result in this assay.     Assessment & Plan  1. Diarrhea, unspecified type - Comprehensive metabolic panel; Future - CBC with Differential/Platelet; Future - Gastrointestinal Panel by PCR , Stool; Future  2. Nausea - Comprehensive metabolic panel; Future - CBC with Differential/Platelet; Future  Will obtain labs, does not appear to be surgical/acute abdomen at this time, however she is aware of ER precautions and will go if worsening/new symptoms.  Monitor temperature ongoing.  Will go to [facility] today for Stat Labs to check for anemia/dehydration/WBC status.  -  Red flags and when to present for emergency care or RTC including fever >101.61F, chest pain, shortness of breath, new/worsening/un-resolving symptoms, reviewed with patient at time of visit. Follow up and care instructions discussed and provided in AVS. - I discussed the assessment and treatment plan with the patient. The patient was provided an opportunity to ask questions and all were answered. The patient agreed with the plan and demonstrated an understanding of the instructions.  I provided 14 minutes of non-face-to-face time during this encounter.  Doren Custard, FNP

## 2019-04-17 ENCOUNTER — Ambulatory Visit: Payer: 59 | Admitting: Gastroenterology

## 2019-04-18 ENCOUNTER — Encounter: Payer: Self-pay | Admitting: Family Medicine

## 2019-04-29 ENCOUNTER — Ambulatory Visit: Payer: 59 | Admitting: Gastroenterology

## 2019-05-06 ENCOUNTER — Encounter: Payer: Self-pay | Admitting: Family Medicine

## 2019-07-14 ENCOUNTER — Ambulatory Visit: Payer: 59 | Admitting: Family Medicine

## 2019-08-11 ENCOUNTER — Telehealth: Payer: Self-pay | Admitting: Family Medicine

## 2019-08-11 DIAGNOSIS — R928 Other abnormal and inconclusive findings on diagnostic imaging of breast: Secondary | ICD-10-CM

## 2019-08-11 NOTE — Telephone Encounter (Addendum)
Call patient to remind - she needs her 6 month left breast US repeat - I have placed the order, she needs to call [facility] to schedule.

## 2019-08-12 NOTE — Telephone Encounter (Signed)
Left patient message with number to [facility] to call and schedule

## 2019-08-15 IMAGING — MG DIGITAL DIAGNOSTIC BILATERAL MAMMOGRAM WITH TOMO AND CAD
4 of 9 series · 4 of 29 positions shown · non-contrast
Comparison: None.

CLINICAL DATA: 33-year-old female presenting for evaluation of
spontaneous milky left nipple discharge. The patient states that she
has had this for years, notices it in the shower and on her bra. She
has bilateral diffuse tenderness in the upper-outer quadrants of
both breasts, which is chronic.

EXAM:
DIGITAL DIAGNOSTIC RIGHT MAMMOGRAM WITH CAD AND TOMO
ULTRASOUND LEFT BREAST

[R CC synth-2D]
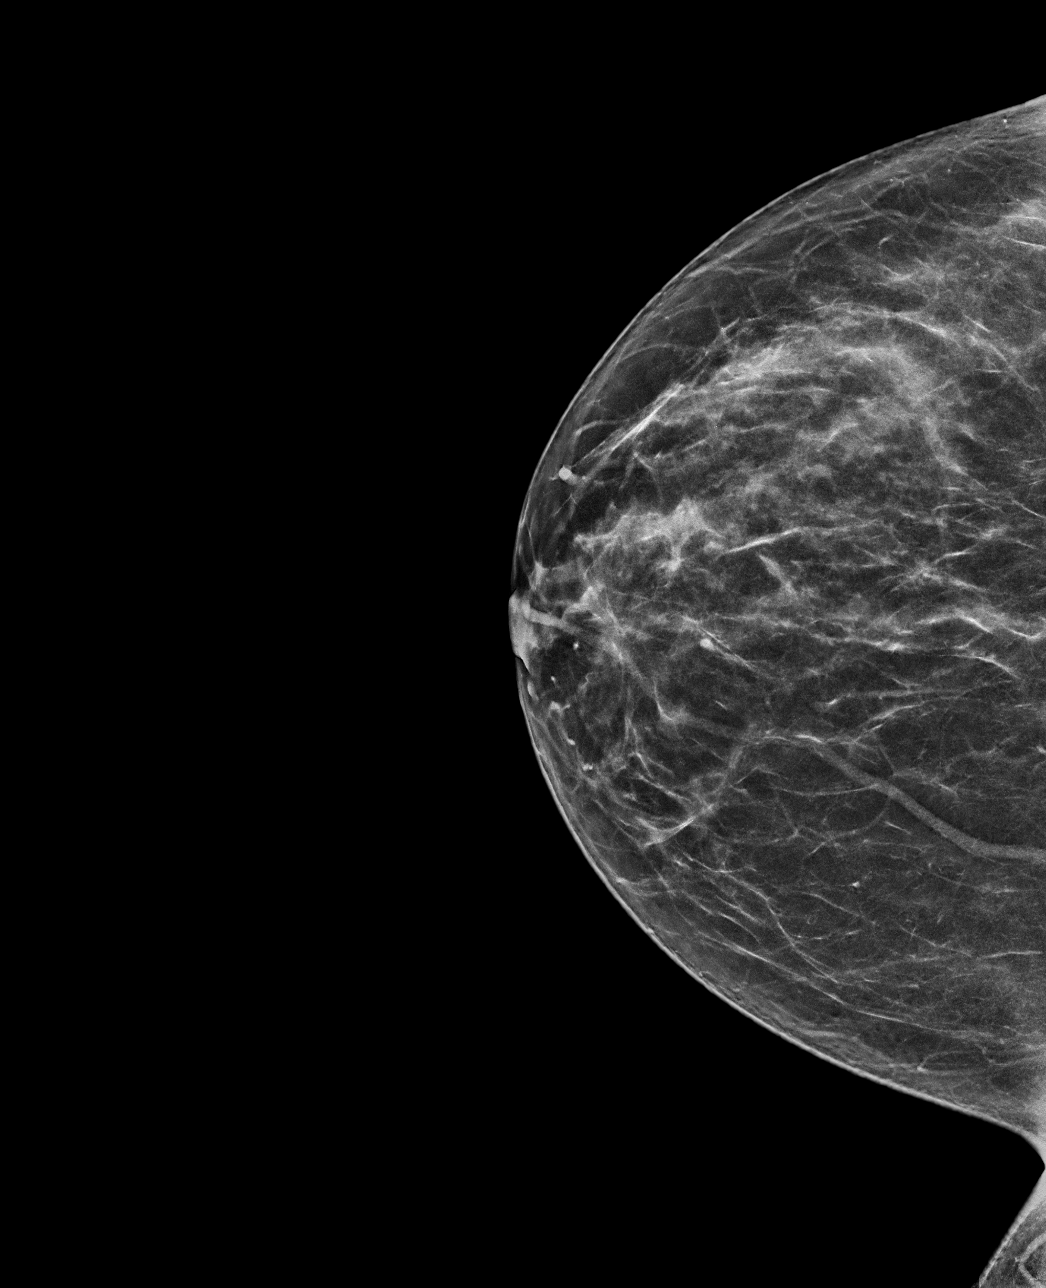

[L MLO synth-2D (1 of 2)]
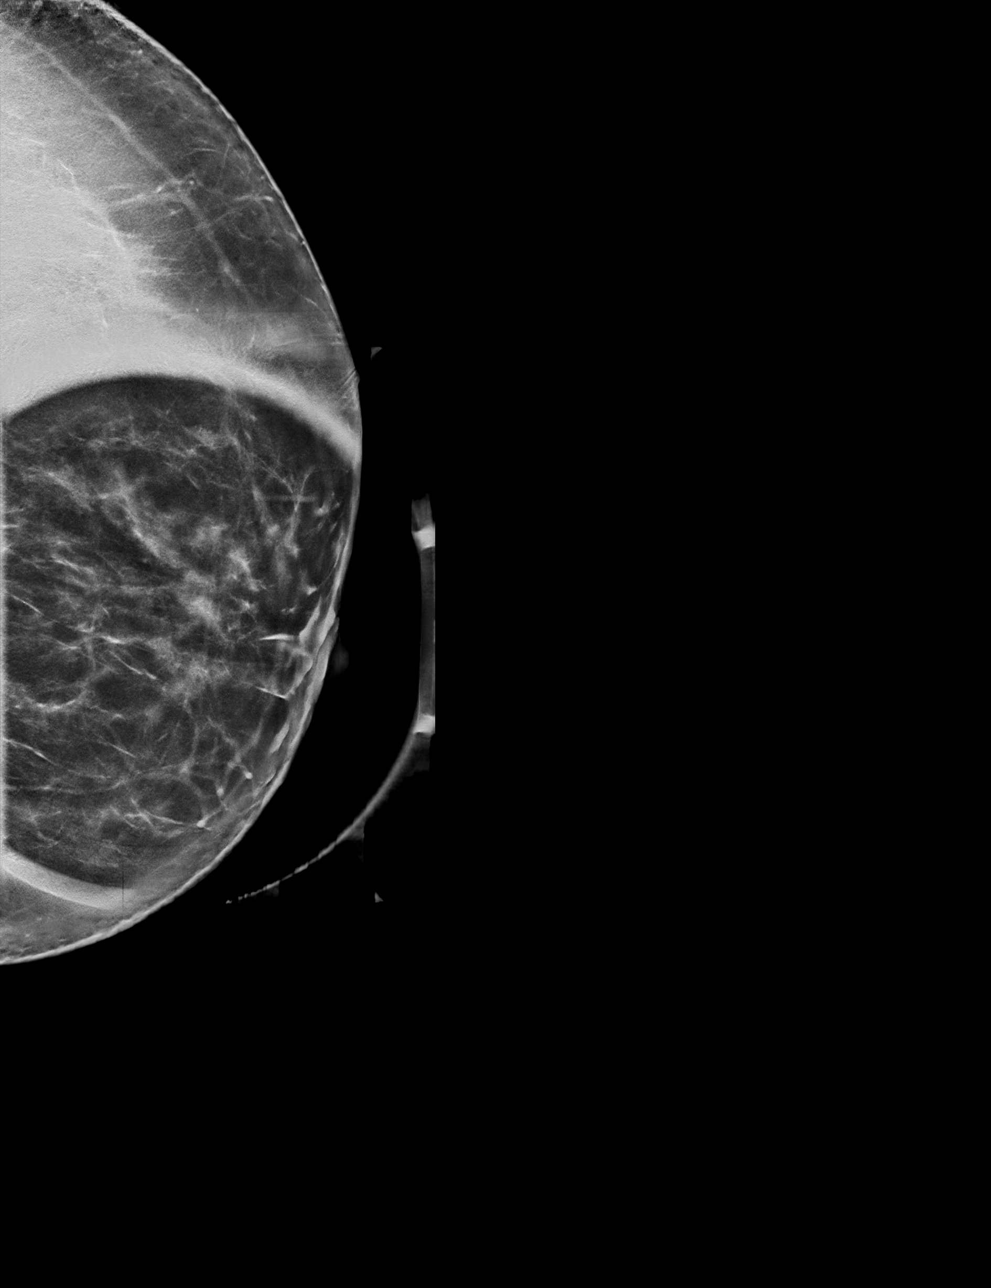

[R MLO synth-2D]
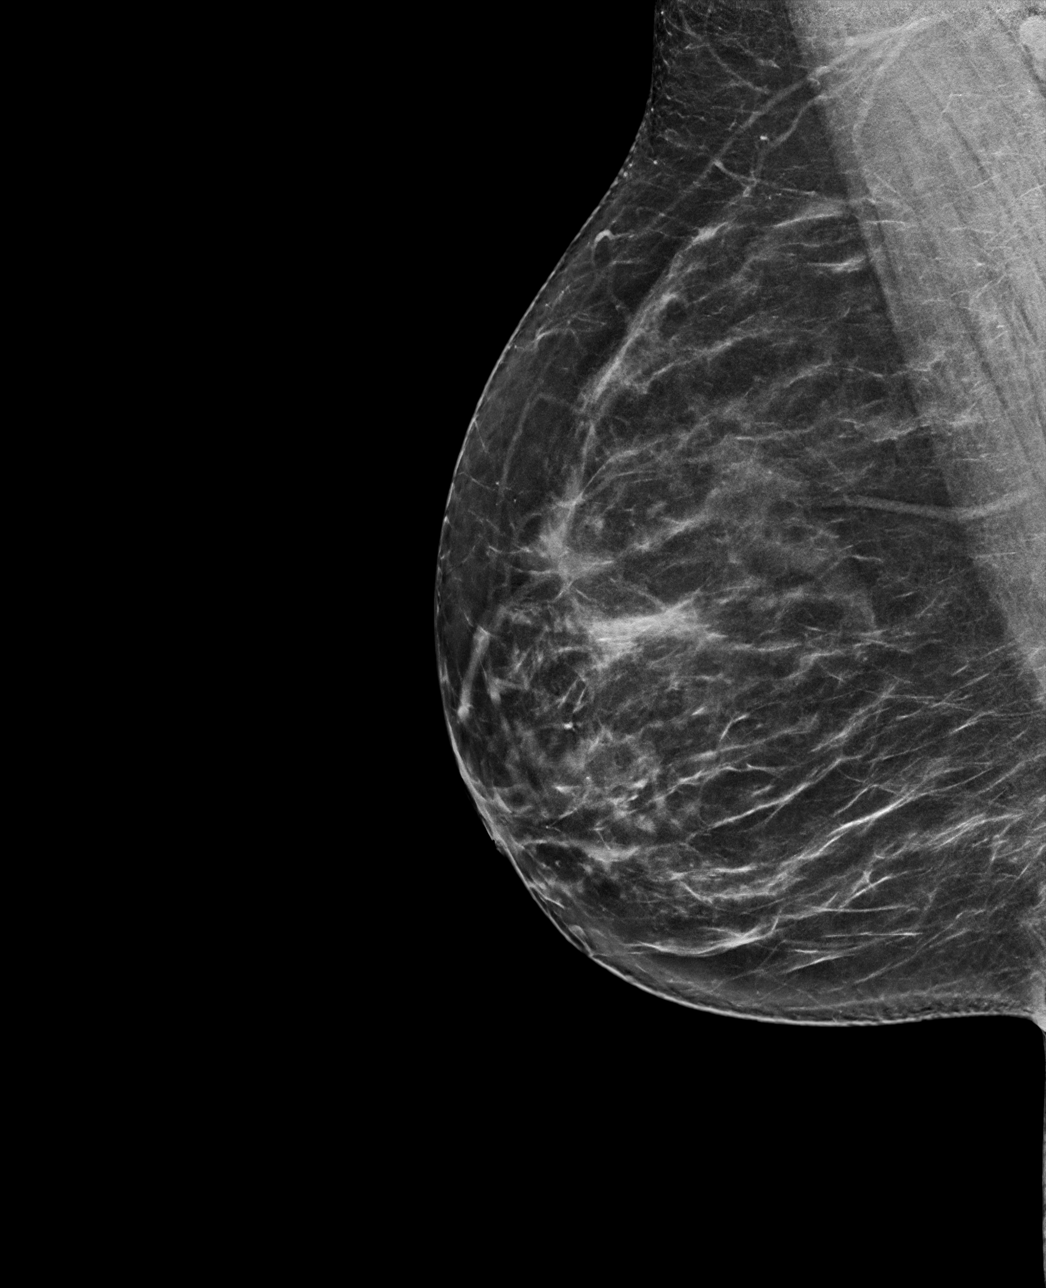

[L MLO synth-2D (2 of 2)]
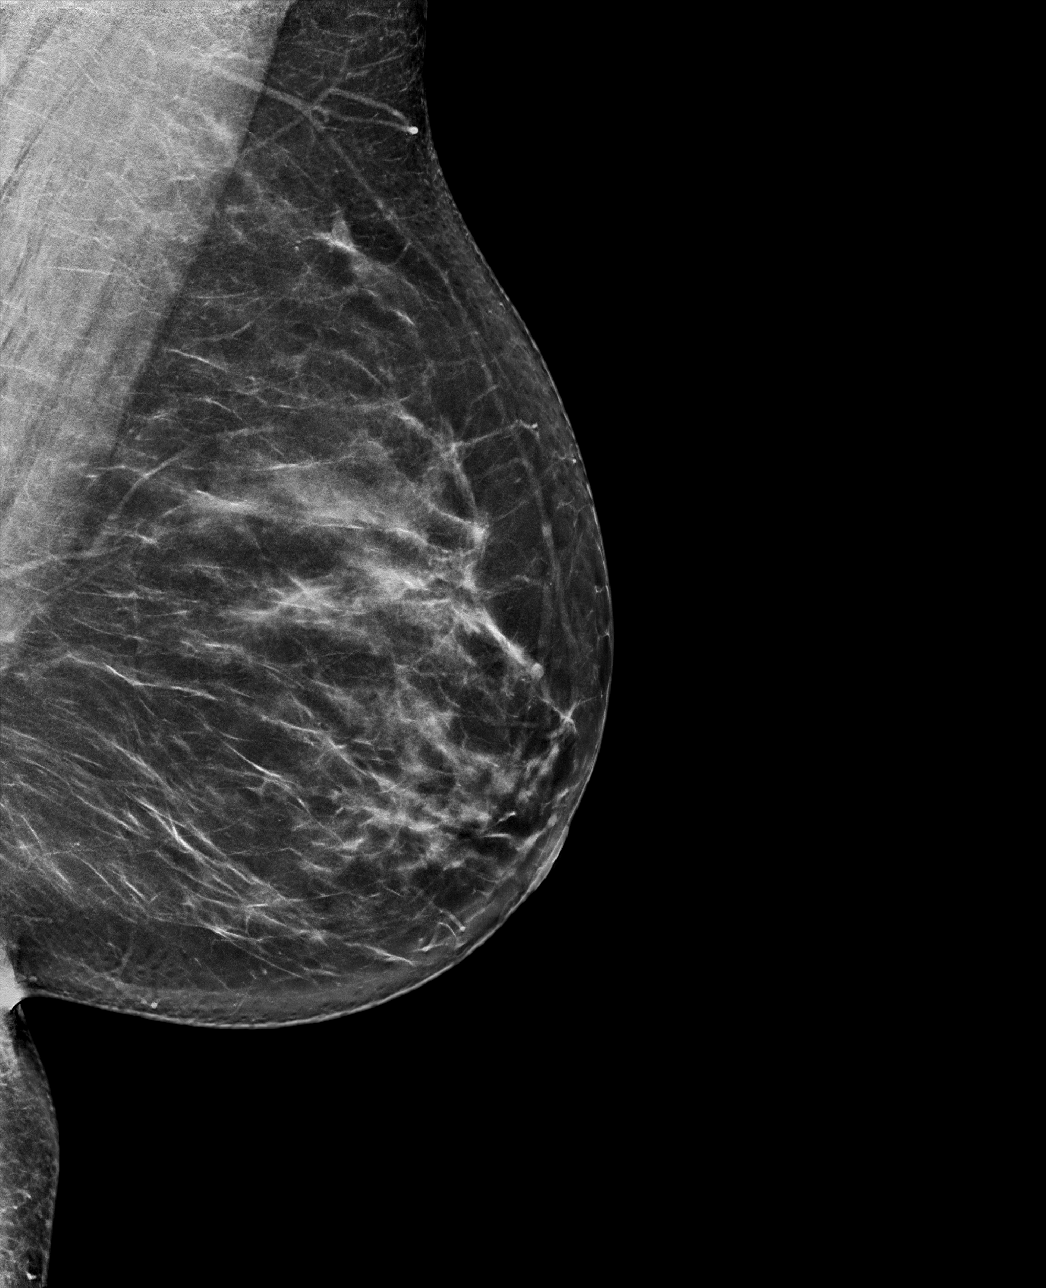

[4 of 29 positions shown; findings below may reference images not displayed]

ACR Breast Density Category c: The breast tissue is heterogeneously
dense, which may obscure small masses.
FINDINGS: There is a possible low-density oval mass in the upper outer
anterior left breast measuring 8 mm. No other suspicious findings
are seen in the retroareolar left breast. No suspicious
calcifications, masses or areas of distortion are seen in the
bilateral breasts.

Mammographic images were processed with CAD.

Ultrasound of the retroareolar left breast demonstrates normal
fibroglandular tissue. No masses or suspicious areas of shadowing
are identified. In the left breast at 1 o'clock, 2 cm from the
nipple there is a circumscribed oval hypoechoic mass measuring 7 x 3
x 7 mm, likely corresponding with the mass identified
mammographically. There is another smaller mass at 3 o'clock in the
retroareolar left breast which measures 3 x 2 x 2 mm.
IMPRESSION: 1. There are 2 probably benign masses in the left breast; one at 1
o'clock and the other at 3 o'clock. These are favored to represent
fibroadenomas or possibly complicated cysts.

2. There are no suspicious mammographic or targeted sonographic
abnormalities to explain the patient's left nipple discharge. The
chronic milky discharge by description is benign, likely
hormonal/physiologic.

3.  No mammographic evidence of malignancy in the bilateral breasts.

RECOMMENDATION:
1. Six-month follow-up left breast ultrasound is recommended for the
2 probably benign left breast masses.

2. Clinical follow-up recommended for the left nipple discharge. Any
further workup should be based on clinical grounds.

I have discussed the findings and recommendations with the patient.
Results were also provided in writing at the conclusion of the
visit. If applicable, a reminder letter will be sent to the patient
regarding the next appointment.

BI-RADS CATEGORY  3: Probably benign.

## 2020-09-06 ENCOUNTER — Other Ambulatory Visit: Payer: Self-pay

## 2020-09-06 ENCOUNTER — Emergency Department: Payer: BC Managed Care – PPO

## 2020-09-06 ENCOUNTER — Emergency Department
Admission: EM | Admit: 2020-09-06 | Discharge: 2020-09-07 | Disposition: A | Discharge status: Home / Self Care | Payer: BC Managed Care – PPO | Attending: Emergency Medicine | Admitting: Emergency Medicine

## 2020-09-06 DIAGNOSIS — U071 COVID-19: Secondary | ICD-10-CM | POA: Insufficient documentation

## 2020-09-06 DIAGNOSIS — R42 Dizziness and giddiness: Secondary | ICD-10-CM | POA: Diagnosis not present

## 2020-09-06 DIAGNOSIS — R079 Chest pain, unspecified: Secondary | ICD-10-CM | POA: Diagnosis not present

## 2020-09-06 DIAGNOSIS — R202 Paresthesia of skin: Secondary | ICD-10-CM | POA: Diagnosis not present

## 2020-09-06 LAB — COMPREHENSIVE METABOLIC PANEL WITH GFR
ALT: 20 U/L (ref 0–44)
AST: 18 U/L (ref 15–41)
Albumin: 4 g/dL (ref 3.5–5.0)
Alkaline Phosphatase: 69 U/L (ref 38–126)
Anion gap: 7 (ref 5–15)
BUN: 16 mg/dL (ref 6–20)
CO2: 25 mmol/L (ref 22–32)
Calcium: 9.2 mg/dL (ref 8.9–10.3)
Chloride: 109 mmol/L (ref 98–111)
Creatinine, Ser: 0.73 mg/dL (ref 0.44–1.00)
GFR, Estimated: >60 mL/min
Glucose, Bld: 104 mg/dL — ABNORMAL HIGH (ref 70–99)
Potassium: 4.3 mmol/L (ref 3.5–5.1)
Sodium: 141 mmol/L (ref 135–145)
Total Bilirubin: 0.5 mg/dL (ref 0.3–1.2)
Total Protein: 6.8 g/dL (ref 6.5–8.1)

## 2020-09-06 LAB — CBC
HCT: 41.9 % (ref 36.0–46.0)
Hemoglobin: 14.7 g/dL (ref 12.0–15.0)
MCH: 30.1 pg (ref 26.0–34.0)
MCHC: 35.1 g/dL (ref 30.0–36.0)
MCV: 85.7 fL (ref 80.0–100.0)
Platelets: 339 10*3/uL (ref 150–400)
RBC: 4.89 MIL/uL (ref 3.87–5.11)
RDW: 13 % (ref 11.5–15.5)
WBC: 8.1 10*3/uL (ref 4.0–10.5)
nRBC: 0 % (ref 0.0–0.2)

## 2020-09-06 LAB — TROPONIN I (HIGH SENSITIVITY): Troponin I (High Sensitivity): <2 ng/L

## 2020-09-06 MED ORDER — KETOROLAC TROMETHAMINE 30 MG/ML IJ SOLN
30.0000 mg | Freq: Once | INTRAMUSCULAR | Status: AC
  Administered 2020-09-06: 30 mg via INTRAVENOUS
  Filled 2020-09-06: qty 1

## 2020-09-06 MED ORDER — SODIUM CHLORIDE 0.9 % IV BOLUS (SEPSIS)
1000.0000 mL | Freq: Once | INTRAVENOUS | Status: AC
  Administered 2020-09-06: 1000 mL via INTRAVENOUS

## 2020-09-06 NOTE — ED Triage Notes (Signed)
Pt in with co chest pain for over a week, no hx of heart disease. States feels dizzy, near syncopal episode. Did go to [facility] ED for the same and was told it was chest wall inflammation. Here for persistent pain and dizziness.

## 2020-09-06 NOTE — ED Provider Notes (Signed)
[facility] Emergency Department Provider Note  ____________________________________________   Event Date/Time   First MD Initiated Contact with Patient 09/06/20 2321     (approximate)  I have reviewed the triage vital signs and the nursing notes.   HISTORY  Chief Complaint Chest Pain    HPI Sharon Andrade is a 35 y.o. female with no significant past medical history who presents to the emergency department with right sided sharp chest pain worse with movement, palpation, deep inspiration, raising her arms.  Did feel some tingling down her right arm.  Symptoms have been ongoing for the past 1 to 2 weeks.  She was diagnosed with COVID-19 on 08/23/2020 at [facility].  She has not had any further fever or cough.  No shortness of breath.  States she has been taking Tylenol at home without relief.  States she has been feeling very lightheaded as well.  No history of PE, DVT, exogenous estrogen use, recent fractures, surgery, trauma, hospitalization, prolonged travel or other immobilization. No lower extremity swelling or pain. No calf tenderness.  She is status post hysterectomy.  On review of records from [facility] on 08/23/2020, patient had a negative chest x-ray, normal troponin and a negative D-dimer.        Past Medical History:  Diagnosis Date  . Depression   . Family history of ovarian cancer    11/19 cancer genetic testing letter sent  . Family history of pancreatic cancer     Patient Active Problem List   Diagnosis Date Noted  . Constipation by delayed colonic transit 01/08/2019  . Blood in stool 01/08/2019  . Abnormal vaginal bleeding 06/06/2018  . Trauma in childhood 06/06/2018    Past Surgical History:  Procedure Laterality Date  . ABDOMINAL HYSTERECTOMY    . CHOLECYSTECTOMY    . COMBINED HYSTEROSCOPY DIAGNOSTIC / D&C     Several d/t multiple miscarriages  . LAPAROSCOPIC BILATERAL SALPINGECTOMY N/A 02/11/2019   Procedure:  LAPAROSCOPIC BILATERAL SALPINGECTOMY, POSSIBLE LEFT OVARIAN CYSTECTOMY;  Surgeon: Vena Austria, MD;  Location: [facility] ORS;  Service: Gynecology;  Laterality: N/A;  . LAPAROSCOPY N/A 02/11/2019   Procedure: LAPAROSCOPY DIAGNOSTIC;  Surgeon: Vena Austria, MD;  Location: [facility] ORS;  Service: Gynecology;  Laterality: N/A;  . TUBAL LIGATION      Prior to Admission medications   Medication Sig Start Date End Date Taking? Authorizing Provider  HYDROcodone-acetaminophen (NORCO/VICODIN) 5-325 MG tablet Take 1 tablet by mouth every 4 (four) hours as needed. 09/07/20  Yes Katharin Schneider, Layla Maw, DO  ibuprofen (ADVIL) 800 MG tablet Take 1 tablet (800 mg total) by mouth every 8 (eight) hours as needed for mild pain. 09/07/20  Yes Rahkim Rabalais, Baxter Hire N, DO  ondansetron (ZOFRAN ODT) 4 MG disintegrating tablet Take 1 tablet (4 mg total) by mouth every 6 (six) hours as needed for nausea or vomiting. 09/07/20  Yes Reo Portela, Baxter Hire N, DO  albuterol (PROVENTIL HFA;VENTOLIN HFA) 108 (90 Base) MCG/ACT inhaler Inhale into the lungs. 10/01/17   [provider]  buPROPion (WELLBUTRIN XL) 150 MG 24 hr tablet Take 1 tablet (150 mg total) by mouth daily. 02/07/19   Doren Custard, FNP  busPIRone (BUSPAR) 7.5 MG tablet TAKE 1 TABLET BY MOUTH NIGHTLY. AFTER 7 DAYS, MAY INCREASE TO 1 TABLET BY MOUTH EVERY 8 HOURS AS NEEDED FOR ANXIETY (MAX 3 TABLETS PER DAY) 02/07/19   Doren Custard, FNP  Insulin Pen Needle (NOVOFINE) 32G X 6 MM MISC 1 each by Does not apply route  daily. 03/14/19   Doren Custard, FNP  Liraglutide -Weight Management (SAXENDA) 18 MG/3ML SOPN Inject 1.2 mg into the skin daily. 03/14/19   Doren Custard, FNP  Multiple Vitamin (MULTIVITAMIN) tablet Take 1 tablet by mouth daily.    [provider]  omeprazole (PRILOSEC) 20 MG capsule Take 1 capsule (20 mg total) by mouth daily for 30 days. 01/21/19 02/20/19  Pasty Spillers, MD  ondansetron (ZOFRAN) 4 MG tablet Take 1 tablet (4 mg total) by mouth every 8 (eight)  hours as needed for nausea or vomiting. 10/03/18   Doren Custard, FNP    Allergies Penicillins and Tramadol  Family History  Problem Relation Age of Onset  . COPD Mother        smoker  . Ovarian cancer Mother   . Emphysema Father        smoker  . COPD Maternal Grandmother        smoker  . Colon cancer Maternal Grandfather   . Depression Paternal Grandfather        Shot himself; pt found him when she was 35yo  . Breast cancer Maternal Aunt 30  . Ovarian cancer Maternal Aunt   . Pancreatic cancer Maternal Uncle     Social History Social History   Tobacco Use  . Smoking status: Never Smoker  . Smokeless tobacco: Never Used  Vaping Use  . Vaping Use: Never used  Substance Use Topics  . Alcohol use: Never  . Drug use: Never    Review of Systems Constitutional: No fever. Eyes: No visual changes. ENT: No sore throat. Cardiovascular: Denies chest pain. Respiratory: Denies shortness of breath. Gastrointestinal: No nausea, vomiting, diarrhea. Genitourinary: Negative for dysuria. Musculoskeletal: Negative for back pain. Skin: Negative for rash. Neurological: Negative for focal weakness or numbness.  ____________________________________________   PHYSICAL EXAM:  VITAL SIGNS: ED Triage Vitals [09/06/20 1953]  Enc Vitals Group     BP 136/83     Pulse Rate 93     Resp 20     Temp 98.3 F (36.8 C)     Temp Source Oral     SpO2 98 %     Weight 185 lb (83.9 kg)     Height 5\' 7"  (1.702 m)     Head Circumference      Peak Flow      Pain Score 8     Pain Loc      Pain Edu?      Excl. in GC?    CONSTITUTIONAL: Alert and oriented and responds appropriately to questions. Well-appearing; well-nourished HEAD: Normocephalic EYES: Conjunctivae clear, pupils appear equal, EOM appear intact ENT: normal nose; moist mucous membranes NECK: Supple, normal ROM CARD: RRR; S1 and S2 appreciated; no murmurs, no clicks, no rubs, no gallops CHEST:  Chest wall is tender to  palpation over the right anterior chest wall.  No crepitus, ecchymosis, erythema, warmth, rash or other lesions present.   RESP: Normal chest excursion without splinting or tachypnea; breath sounds clear and equal bilaterally; no wheezes, no rhonchi, no rales, no hypoxia or respiratory distress, speaking full sentences ABD/GI: Normal bowel sounds; non-distended; soft, non-tender, no rebound, no guarding, no peritoneal signs, no hepatosplenomegaly BACK: The back appears normal EXT: Normal ROM in all joints; no deformity noted, no edema; no cyanosis, no calf tenderness or calf swelling, 2+ radial pulse on the right arm SKIN: Normal color for age and race; warm; no rash on exposed skin NEURO: Moves all extremities equally PSYCH:  The patient's mood and manner are appropriate.  ____________________________________________   LABS (all labs ordered are listed, but only abnormal results are displayed)  Labs Reviewed  COMPREHENSIVE METABOLIC PANEL - Abnormal; Notable for the following components:      Result Value   Glucose, Bld 104 (*)    All other components within normal limits  CBC  FIBRIN DERIVATIVES D-DIMER [facility] ONLY)  TROPONIN I (HIGH SENSITIVITY)   ____________________________________________  EKG   EKG Interpretation  Date/Time:  Monday September 06 2020 19:50:26 EST Ventricular Rate:  90 PR Interval:  170 QRS Duration: 86 QT Interval:  354 QTC Calculation: 433 R Axis:   94 Text Interpretation: Normal sinus rhythm Rightward axis Borderline ECG No significant change since last tracing Confirmed by Rochele Raring [phone]) on 09/06/2020 11:22:15 PM       ____________________________________________  RADIOLOGY Normajean Baxter Marke Goodwyn, personally viewed and evaluated these images (plain radiographs) as part of my medical decision making, as well as reviewing the written report by the radiologist.  ED MD interpretation: Chest x-ray shows no acute abnormalities.  Official radiology  report(s): DG Chest 2 View  Result Date: 09/07/2020 CLINICAL DATA:  Right-sided chest pain EXAM: CHEST - 2 VIEW COMPARISON:  None. FINDINGS: The heart size and mediastinal contours are within normal limits. Both lungs are clear. The visualized skeletal structures are unremarkable. IMPRESSION: No active cardiopulmonary disease. Electronically Signed   By: Jonna Clark M.D.   On: 09/07/2020 00:11    ____________________________________________   PROCEDURES  Procedure(s) performed (including Critical Care):  Procedures    ____________________________________________   INITIAL IMPRESSION / ASSESSMENT AND PLAN / ED COURSE  As part of my medical decision making, I reviewed the following data within the electronic MEDICAL RECORD NUMBER Nursing notes reviewed and incorporated, Labs reviewed, EKG interpreted NSR, Old EKG reviewed, Radiograph reviewed, Notes from prior ED visits and [place] Controlled Substance Database         Patient here with right-sided chest pain, dizziness, tingling down the right arm.  Troponin here is negative.  EKG nonischemic.  Recently diagnosed with COVID-19.  Currently her lungs are clear without hypoxia or increased work of breathing.  Will repeat chest x-ray today to ensure no changes.  We will also obtain D-dimer given she is at risk for PE given recent Covid diagnosis although pain seems to be musculoskeletal in nature.  Low suspicion for ACS as she has no significant risk factors for the same and pain seems very atypical.  No signs of volume overload on exam.  She is no longer having any infectious symptoms.  Will treat pain and dizziness with Toradol, IV fluids.  She is status post hysterectomy.     12:49 AM  Pt's chest x-ray is clear.  D-dimer negative.  Discussed with patient that I suspect her pain is musculoskeletal in nature and have recommended anti-inflammatories.  Recommended follow-up with primary care if symptoms continue but do not feel anything  life-threatening is present today.  She is comfortable with this plan.  She reports minimal improvement with Toradol.  Will give dose of morphine prior to discharge home.  Her husband will be driving her.  At this time, I do not feel there is any life-threatening condition present. I have reviewed, interpreted and discussed all results (EKG, imaging, lab, urine as appropriate) and exam findings with patient/family. I have reviewed nursing notes and appropriate previous records.  I feel the patient is safe to be discharged home without further emergent workup and can  continue workup as an outpatient as needed. Discussed usual and customary return precautions. Patient/family verbalize understanding and are comfortable with this plan.  Outpatient follow-up has been provided as needed. All questions have been answered.  ____________________________________________   FINAL CLINICAL IMPRESSION(S) / ED DIAGNOSES  Final diagnoses:  Right-sided chest pain     ED Discharge Orders         Ordered    ibuprofen (ADVIL) 800 MG tablet  Every 8 hours PRN        09/07/20 0053    HYDROcodone-acetaminophen (NORCO/VICODIN) 5-325 MG tablet  Every 4 hours PRN        09/07/20 0053    ondansetron (ZOFRAN ODT) 4 MG disintegrating tablet  Every 6 hours PRN        09/07/20 0053          *Please note:  Sharon Andrade was evaluated in Emergency Department on 09/07/2020 for the symptoms described in the history of present illness. She was evaluated in the context of the global COVID-19 pandemic, which necessitated consideration that the patient might be at risk for infection with the SARS-CoV-2 virus that causes COVID-19. Institutional protocols and algorithms that pertain to the evaluation of patients at risk for COVID-19 are in a state of rapid change based on information released by regulatory bodies including the CDC and federal and state organizations. These policies and algorithms were followed during the  patient's care in the ED.  Some ED evaluations and interventions may be delayed as a result of limited staffing during and the pandemic.*   Note:  This document was prepared using Dragon voice recognition software and may include unintentional dictation errors.   Jjesus Dingley, Layla Maw, DO 09/07/20 [phone]

## 2020-09-07 LAB — FIBRIN DERIVATIVES D-DIMER (ARMC ONLY): Fibrin derivatives D-dimer (ARMC): 192.11 ng/mL (FEU) (ref 0.00–499.00)

## 2020-09-07 MED ORDER — ONDANSETRON HCL 4 MG/2ML IJ SOLN
4.0000 mg | Freq: Once | INTRAMUSCULAR | Status: AC
  Administered 2020-09-07: 4 mg via INTRAVENOUS
  Filled 2020-09-07: qty 2

## 2020-09-07 MED ORDER — HYDROCODONE-ACETAMINOPHEN 5-325 MG PO TABS: 1.0000 | ORAL_TABLET | ORAL | 0 refills | Status: AC | PRN

## 2020-09-07 MED ORDER — MORPHINE SULFATE (PF) 4 MG/ML IV SOLN
4.0000 mg | Freq: Once | INTRAVENOUS | Status: AC
Start: 2020-09-07 — End: 2020-09-07
  Administered 2020-09-07: 4 mg via INTRAVENOUS
  Filled 2020-09-07: qty 1

## 2020-09-07 MED ORDER — ONDANSETRON 4 MG PO TBDP: 4.0000 mg | ORAL_TABLET | Freq: Four times a day (QID) | ORAL | 0 refills | Status: AC | PRN

## 2020-09-07 MED ORDER — IBUPROFEN 800 MG PO TABS: 800.0000 mg | ORAL_TABLET | Freq: Three times a day (TID) | ORAL | 0 refills | Status: AC | PRN

## 2020-09-07 NOTE — Discharge Instructions (Signed)
Your cardiac labs, chest x-ray, EKG were normal today's.  No sign of heart attack, pneumonia, rib fracture, fluid on your lungs, collapsed lung, blood clot today.  I suspect your pain is musculoskeletal in nature.  If pain is not improving with anti-inflammatories, I recommend close follow-up with your primary care physician.  You are being provided a prescription for opiates (also known as narcotics) for pain control.  Opiates can be addictive and should only be used when absolutely necessary for pain control when other alternatives do not work.  We recommend you only use them for the recommended amount of time and only as prescribed.  Please do not take with other sedative medications or alcohol.  Please do not drive, operate machinery, make important decisions while taking opiates.  Please note that these medications can be addictive and have high abuse potential.  Patients can become addicted to narcotics after only taking them for a few days.  Please keep these medications locked away from children, teenagers or any family members with history of substance abuse.  Narcotic pain medicine may also make you constipated.  You may use over-the-counter medications such as MiraLAX, Colace to prevent constipation.  If you become constipated you may use over-the-counter enemas as needed.  Itching and nausea are common side effects of narcotic pain medication.  If you develop uncontrolled vomiting or a rash, please stop these medications.

## 2020-09-07 NOTE — ED Notes (Signed)
Patient transported to X-ray 

## 2020-09-07 NOTE — ED Notes (Signed)
Pt currently denies all pain after medication administration. Provider aware and states patient is able to be discharged at this time.

## 2020-09-29 ENCOUNTER — Other Ambulatory Visit: Payer: Self-pay

## 2020-09-29 ENCOUNTER — Emergency Department
Admission: EM | Admit: 2020-09-29 | Discharge: 2020-09-30 | Disposition: A | Discharge status: Home / Self Care | Payer: BC Managed Care – PPO | Attending: Emergency Medicine | Admitting: Emergency Medicine

## 2020-09-29 ENCOUNTER — Emergency Department: Payer: BC Managed Care – PPO

## 2020-09-29 DIAGNOSIS — Z8616 Personal history of COVID-19: Secondary | ICD-10-CM | POA: Diagnosis not present

## 2020-09-29 DIAGNOSIS — J45901 Unspecified asthma with (acute) exacerbation: Secondary | ICD-10-CM | POA: Insufficient documentation

## 2020-09-29 DIAGNOSIS — J189 Pneumonia, unspecified organism: Secondary | ICD-10-CM | POA: Diagnosis not present

## 2020-09-29 DIAGNOSIS — J4521 Mild intermittent asthma with (acute) exacerbation: Secondary | ICD-10-CM

## 2020-09-29 DIAGNOSIS — R059 Cough, unspecified: Secondary | ICD-10-CM | POA: Diagnosis present

## 2020-09-29 MED ORDER — DOXYCYCLINE HYCLATE 100 MG PO TABS
100.0000 mg | ORAL_TABLET | Freq: Once | ORAL | Status: AC
  Administered 2020-09-29: 100 mg via ORAL
  Filled 2020-09-29: qty 1

## 2020-09-29 MED ORDER — PREDNISONE 10 MG PO TABS: 50.0000 mg | ORAL_TABLET | Freq: Every day | ORAL | 0 refills | Status: AC

## 2020-09-29 MED ORDER — BENZONATATE 100 MG PO CAPS: 100.0000 mg | ORAL_CAPSULE | Freq: Three times a day (TID) | ORAL | 0 refills | Status: AC | PRN

## 2020-09-29 MED ORDER — AMOXICILLIN 500 MG PO CAPS
500.0000 mg | ORAL_CAPSULE | Freq: Once | ORAL | Status: AC
  Administered 2020-09-29: 500 mg via ORAL
  Filled 2020-09-29: qty 1

## 2020-09-29 MED ORDER — BENZONATATE 100 MG PO CAPS
100.0000 mg | ORAL_CAPSULE | Freq: Once | ORAL | Status: AC
  Administered 2020-09-29: 100 mg via ORAL
  Filled 2020-09-29: qty 1

## 2020-09-29 MED ORDER — IPRATROPIUM-ALBUTEROL 0.5-2.5 (3) MG/3ML IN SOLN
3.0000 mL | Freq: Once | RESPIRATORY_TRACT | Status: AC
  Administered 2020-09-29: 3 mL via RESPIRATORY_TRACT
  Filled 2020-09-29: qty 3

## 2020-09-29 MED ORDER — PREDNISONE 20 MG PO TABS
60.0000 mg | ORAL_TABLET | Freq: Once | ORAL | Status: AC
  Administered 2020-09-29: 60 mg via ORAL
  Filled 2020-09-29: qty 3

## 2020-09-29 MED ORDER — DOXYCYCLINE MONOHYDRATE 100 MG PO TABS: 100.0000 mg | ORAL_TABLET | Freq: Two times a day (BID) | ORAL | 0 refills | Status: AC

## 2020-09-29 MED ORDER — AMOXICILLIN 500 MG PO TABS: 500.0000 mg | ORAL_TABLET | Freq: Two times a day (BID) | ORAL | 0 refills | Status: AC

## 2020-09-29 NOTE — ED Provider Notes (Signed)
[facility] Emergency Department Provider Note  ____________________________________________   Event Date/Time   First MD Initiated Contact with Patient 09/29/20 2021     (approximate)  I have reviewed the triage vital signs and the nursing notes.   HISTORY  Chief Complaint Cough  HPI Sharon Andrade is a 35 y.o. female who presents to the emergency department for evaluation of cough, runny nose and general malaise for the last few days.  She denies known fever.  Patient reports a history of Covid infection, the most recent being approximately 1 month ago.  Patient states that this last episode of Covid, she had prolonged respiratory symptoms, though she had been completely symptom-free for at least a week before symptoms returned for her.  She reports significant increase in wheezing with a known history of asthma.  She denies any chest pain or frank shortness of breath.  She describes her cough as productive.  Denies any other known sick contacts.  Has attempted Robitussin at home for the cough without any improvement.         Past Medical History:  Diagnosis Date  . Depression   . Family history of ovarian cancer    11/19 cancer genetic testing letter sent  . Family history of pancreatic cancer     Patient Active Problem List   Diagnosis Date Noted  . Constipation by delayed colonic transit 01/08/2019  . Blood in stool 01/08/2019  . Abnormal vaginal bleeding 06/06/2018  . Trauma in childhood 06/06/2018    Past Surgical History:  Procedure Laterality Date  . ABDOMINAL HYSTERECTOMY    . CHOLECYSTECTOMY    . COMBINED HYSTEROSCOPY DIAGNOSTIC / D&C     Several d/t multiple miscarriages  . LAPAROSCOPIC BILATERAL SALPINGECTOMY N/A 02/11/2019   Procedure: LAPAROSCOPIC BILATERAL SALPINGECTOMY, POSSIBLE LEFT OVARIAN CYSTECTOMY;  Surgeon: Vena Austria, MD;  Location: [facility] ORS;  Service: Gynecology;  Laterality: N/A;  . LAPAROSCOPY N/A 02/11/2019    Procedure: LAPAROSCOPY DIAGNOSTIC;  Surgeon: Vena Austria, MD;  Location: [facility] ORS;  Service: Gynecology;  Laterality: N/A;  . TUBAL LIGATION      Prior to Admission medications   Medication Sig Start Date End Date Taking? Authorizing Provider  amoxicillin (AMOXIL) 500 MG tablet Take 1 tablet (500 mg total) by mouth 2 (two) times daily for 7 days. 09/29/20 10/06/20 Yes Dorice Stiggers, Ruben Gottron, PA  benzonatate (TESSALON PERLES) 100 MG capsule Take 1 capsule (100 mg total) by mouth 3 (three) times daily as needed for cough. 09/29/20 09/29/21 Yes Dori Devino, Ruben Gottron, PA  doxycycline (ADOXA) 100 MG tablet Take 1 tablet (100 mg total) by mouth 2 (two) times daily for 7 days. 09/29/20 10/06/20 Yes Jordany Russett, Ruben Gottron, PA  predniSONE (DELTASONE) 10 MG tablet Take 5 tablets (50 mg total) by mouth daily for 5 days. 09/29/20 10/04/20 Yes Joseeduardo Brix, Ruben Gottron, PA  albuterol (PROVENTIL HFA;VENTOLIN HFA) 108 (90 Base) MCG/ACT inhaler Inhale into the lungs. 10/01/17   [provider]  buPROPion (WELLBUTRIN XL) 150 MG 24 hr tablet Take 1 tablet (150 mg total) by mouth daily. 02/07/19   Doren Custard, FNP  busPIRone (BUSPAR) 7.5 MG tablet TAKE 1 TABLET BY MOUTH NIGHTLY. AFTER 7 DAYS, MAY INCREASE TO 1 TABLET BY MOUTH EVERY 8 HOURS AS NEEDED FOR ANXIETY (MAX 3 TABLETS PER DAY) 02/07/19   Doren Custard, FNP  HYDROcodone-acetaminophen (NORCO/VICODIN) 5-325 MG tablet Take 1 tablet by mouth every 4 (four) hours as needed. 09/07/20   Ward, Layla Maw, DO  ibuprofen (ADVIL) 800 MG tablet Take 1 tablet (800 mg total) by mouth every 8 (eight) hours as needed for mild pain. 09/07/20   Ward, Layla Maw, DO  Insulin Pen Needle (NOVOFINE) 32G X 6 MM MISC 1 each by Does not apply route daily. 03/14/19   Doren Custard, FNP  Liraglutide -Weight Management (SAXENDA) 18 MG/3ML SOPN Inject 1.2 mg into the skin daily. 03/14/19   Doren Custard, FNP  Multiple Vitamin (MULTIVITAMIN) tablet Take 1 tablet by mouth daily.    [provider]   omeprazole (PRILOSEC) 20 MG capsule Take 1 capsule (20 mg total) by mouth daily for 30 days. 01/21/19 02/20/19  Pasty Spillers, MD  ondansetron (ZOFRAN ODT) 4 MG disintegrating tablet Take 1 tablet (4 mg total) by mouth every 6 (six) hours as needed for nausea or vomiting. 09/07/20   Ward, Layla Maw, DO  ondansetron (ZOFRAN) 4 MG tablet Take 1 tablet (4 mg total) by mouth every 8 (eight) hours as needed for nausea or vomiting. 10/03/18   Doren Custard, FNP    Allergies Penicillins and Tramadol  Family History  Problem Relation Age of Onset  . COPD Mother        smoker  . Ovarian cancer Mother   . Emphysema Father        smoker  . COPD Maternal Grandmother        smoker  . Colon cancer Maternal Grandfather   . Depression Paternal Grandfather        Shot himself; pt found him when she was 35yo  . Breast cancer Maternal Aunt 30  . Ovarian cancer Maternal Aunt   . Pancreatic cancer Maternal Uncle     Social History Social History   Tobacco Use  . Smoking status: Never Smoker  . Smokeless tobacco: Never Used  Vaping Use  . Vaping Use: Never used  Substance Use Topics  . Alcohol use: Never  . Drug use: Never    Review of Systems Constitutional: + Malaise, no fever/chills Eyes: No visual changes. ENT: No sore throat. Cardiovascular: Denies chest pain. Respiratory: + Cough, denies shortness of breath. Gastrointestinal: No abdominal pain.  No nausea, no vomiting.  No diarrhea.  No constipation. Genitourinary: Negative for dysuria. Musculoskeletal: Negative for back pain. Skin: Negative for rash. Neurological: Negative for headaches, focal weakness or numbness.   ____________________________________________   PHYSICAL EXAM:  VITAL SIGNS: ED Triage Vitals  Enc Vitals Group     BP 09/29/20 1950 (!) 133/93     Pulse Rate 09/29/20 1950 96     Resp 09/29/20 1952 18     Temp 09/29/20 1950 98.9 F (37.2 C)     Temp Source 09/29/20 1950 Oral     SpO2 09/29/20 1950 97  %     Weight 09/29/20 1949 175 lb (79.4 kg)     Height 09/29/20 1949 5\' 7"  (1.702 m)     Head Circumference --      Peak Flow --      Pain Score 09/29/20 1949 10     Pain Loc --      Pain Edu? --      Excl. in GC? --    Constitutional: Alert and oriented. Well appearing and in no acute distress. Eyes: Conjunctivae are normal. PERRL. EOMI. Head: Atraumatic. Nose: Mild congestion/rhinnorhea. Mouth/Throat: Mucous membranes are moist.  Oropharynx non-erythematous. Neck: No stridor.   Lymphatic: No anterior cervical lymphadenopathy Cardiovascular: Normal rate, regular rhythm. Grossly normal heart sounds.  Good peripheral circulation. Respiratory: Normal respiratory effort.  No retractions.  There are scattered rhonchi heard throughout the superior lobes, with expiratory wheezing in the bases. Gastrointestinal: Soft and nontender. No distention. No abdominal bruits. No CVA tenderness. Musculoskeletal: No lower extremity tenderness nor edema.  No joint effusions. Neurologic:  Normal speech and language. No gross focal neurologic deficits are appreciated. No gait instability. Skin:  Skin is warm, dry and intact. No rash noted. Psychiatric: Mood and affect are normal. Speech and behavior are normal.  ____________________________________________  RADIOLOGY  Official radiology report(s): DG Chest 1 View  Result Date: 09/29/2020 CLINICAL DATA:  Cough, rhinorrhea EXAM: CHEST  1 VIEW COMPARISON:  09/06/2020 FINDINGS: The heart size and mediastinal contours are within normal limits. Both lungs are clear. The visualized skeletal structures are unremarkable. IMPRESSION: No active disease. Electronically Signed   By: Helyn NumbersAshesh  Parikh MD   On: 09/29/2020 21:49    ____________________________________________   INITIAL IMPRESSION / ASSESSMENT AND PLAN / ED COURSE  As part of my medical decision making, I reviewed the following data within the electronic MEDICAL RECORD NUMBER Nursing notes reviewed and  incorporated, Radiograph reviewed and Notes from prior ED visits        Patient is a 35 year old female who presents to the emergency department for cough, rhinorrhea and general malaise after known Covid infection approximately 1 month ago.  See HPI for further details.  In triage, the patient has grossly normal vital signs with an SPO2 of 97 and is afebrile.  On physical exam, the patient does have rhonchi that are scattered throughout the upper lobes with wheezing noted in the bases.  She otherwise appears grossly well.  She has a history of asthma, has not been tested for COPD but has a history of prolonged childhood exposure to secondhand smoke.  X-ray was obtained and is read as negative by radiology for focal pneumonia.  I do believe there are increased right-sided perihilar markings concerning for early infiltrate.  Given the patient's history of asthma, initiate treatment with DuoNeb, which did improve her symptoms.  A second DuoNeb was provided which also improved symptoms, and she was given a steroid dose.  Given that she had Covid, with recent improvement followed by return of symptoms, known asthma as well as rhonchi appreciated on physical exam, will initiate patient with antibiotic therapy.  Patient states that she usually does not respond well to azithromycin, and will proceed with alternative for community-acquired pneumonia of amoxicillin and doxycycline combination.  Patient does have a noted allergy to penicillins, but would believes that she has tolerated amoxicillin in the past.  We will also continue steroid therapy for the next 5 days.  Patient was also given Jerilynn Somessalon Perles for symptom relief.  She was advised to follow-up with primary care, or return to emergency department with any worsening.  Of note, patient was noted to be mildly tachycardic at 108 at discharge but this was believed to be related to the albuterol administration.  Patient is stable at this time for outpatient  therapy.      ____________________________________________   FINAL CLINICAL IMPRESSION(S) / ED DIAGNOSES  Final diagnoses:  Atypical pneumonia  Mild intermittent asthma with exacerbation     ED Discharge Orders         Ordered    predniSONE (DELTASONE) 10 MG tablet  Daily        09/29/20 2348    doxycycline (ADOXA) 100 MG tablet  2 times daily  09/29/20 2348    amoxicillin (AMOXIL) 500 MG tablet  2 times daily        09/29/20 2348    benzonatate (TESSALON PERLES) 100 MG capsule  3 times daily PRN        09/29/20 2348          *Please note:  Sharon Andrade was evaluated in Emergency Department on 09/29/2020 for the symptoms described in the history of present illness. She was evaluated in the context of the global COVID-19 pandemic, which necessitated consideration that the patient might be at risk for infection with the SARS-CoV-2 virus that causes COVID-19. Institutional protocols and algorithms that pertain to the evaluation of patients at risk for COVID-19 are in a state of rapid change based on information released by regulatory bodies including the CDC and federal and state organizations. These policies and algorithms were followed during the patient's care in the ED.  Some ED evaluations and interventions may be delayed as a result of limited staffing during and the pandemic.*   Note:  This document was prepared using Dragon voice recognition software and may include unintentional dictation errors.   Lucy Chris, PA 09/30/20 0005    Gilles Chiquito, MD 09/30/20 Lyda Jester

## 2020-09-29 NOTE — ED Notes (Signed)
ED Provider at bedside. 

## 2020-09-29 NOTE — ED Triage Notes (Signed)
Pt states she has had cough and runny nose as well as general malaise for the past few days. Pt denies fever. Pt states she has had covid twice, last time being 1 month ago.

## 2021-05-23 ENCOUNTER — Emergency Department: Payer: Self-pay

## 2021-05-23 ENCOUNTER — Other Ambulatory Visit: Payer: Self-pay

## 2021-05-23 ENCOUNTER — Emergency Department
Admission: EM | Admit: 2021-05-23 | Discharge: 2021-05-23 | Disposition: A | Discharge status: Home / Self Care | Payer: Self-pay | Attending: Student in an Organized Health Care Education/Training Program | Admitting: Student in an Organized Health Care Education/Training Program

## 2021-05-23 DIAGNOSIS — R102 Pelvic and perineal pain: Secondary | ICD-10-CM | POA: Insufficient documentation

## 2021-05-23 LAB — URINALYSIS, ROUTINE W REFLEX MICROSCOPIC
Bilirubin Urine: NEGATIVE
Glucose, UA: NEGATIVE mg/dL
Hgb urine dipstick: NEGATIVE
Ketones, ur: 5 mg/dL — AB
Leukocytes,Ua: NEGATIVE
Nitrite: NEGATIVE
Protein, ur: NEGATIVE mg/dL
Specific Gravity, Urine: 1.025 (ref 1.005–1.030)
pH: 5 (ref 5.0–8.0)

## 2021-05-23 LAB — CBC
HCT: 43.1 % (ref 36.0–46.0)
Hemoglobin: 15.3 g/dL — ABNORMAL HIGH (ref 12.0–15.0)
MCH: 30.5 pg (ref 26.0–34.0)
MCHC: 35.5 g/dL (ref 30.0–36.0)
MCV: 86 fL (ref 80.0–100.0)
Platelets: 327 10*3/uL (ref 150–400)
RBC: 5.01 MIL/uL (ref 3.87–5.11)
RDW: 12.6 % (ref 11.5–15.5)
WBC: 7.1 10*3/uL (ref 4.0–10.5)
nRBC: 0 % (ref 0.0–0.2)

## 2021-05-23 LAB — COMPREHENSIVE METABOLIC PANEL
ALT: 18 U/L (ref 0–44)
AST: 16 U/L (ref 15–41)
Albumin: 4.1 g/dL (ref 3.5–5.0)
Alkaline Phosphatase: 65 U/L (ref 38–126)
Anion gap: 6 (ref 5–15)
BUN: 16 mg/dL (ref 6–20)
CO2: 26 mmol/L (ref 22–32)
Calcium: 8.7 mg/dL — ABNORMAL LOW (ref 8.9–10.3)
Chloride: 108 mmol/L (ref 98–111)
Creatinine, Ser: 0.64 mg/dL (ref 0.44–1.00)
GFR, Estimated: >60 mL/min (ref >60)
Glucose, Bld: 88 mg/dL (ref 70–99)
Potassium: 4 mmol/L (ref 3.5–5.1)
Sodium: 140 mmol/L (ref 135–145)
Total Bilirubin: 0.6 mg/dL (ref 0.3–1.2)
Total Protein: 7.2 g/dL (ref 6.5–8.1)

## 2021-05-23 LAB — LIPASE, BLOOD: Lipase: 40 U/L (ref 11–51)

## 2021-05-23 LAB — POC URINE PREG, ED: Preg Test, Ur: NEGATIVE

## 2021-05-23 MED ORDER — KETOROLAC TROMETHAMINE 30 MG/ML IJ SOLN
30.0000 mg | Freq: Once | INTRAMUSCULAR | Status: AC
  Administered 2021-05-23: 30 mg via INTRAMUSCULAR
  Filled 2021-05-23: qty 1

## 2021-05-23 MED ORDER — HYDROCODONE-ACETAMINOPHEN 5-325 MG PO TABS: 1.0000 | ORAL_TABLET | ORAL | 0 refills | Status: AC | PRN

## 2021-05-23 MED ORDER — OXYCODONE-ACETAMINOPHEN 5-325 MG PO TABS
1.0000 | ORAL_TABLET | Freq: Once | ORAL | Status: AC
Start: 2021-05-23 — End: 2021-05-23
  Administered 2021-05-23: 1 via ORAL
  Filled 2021-05-23: qty 1

## 2021-05-23 NOTE — ED Provider Notes (Signed)
[facility] Emergency Department Provider Note    Event Date/Time   First MD Initiated Contact with Patient 05/23/21 1114     (approximate)  I have reviewed the triage vital signs and the nursing notes.   HISTORY  Chief Complaint Abdominal Pain    HPI Sharon Andrade is a 35 y.o. female below listed past medical history presents to the ER for evaluation of several days of intermittent left pelvic pain and left side pain.  Says she has a history of ovarian cysts she is status post hysterectomy.  States the pain feels very similar to previous ovarian cyst but she is also ha kidney stones denies any nausea or vomiting no diarrhea.  No fevers or chills.  Ving some left flank pain.  Thinks there is also had some spots of blood in her urine.  Denies any history  Past Medical History:  Diagnosis Date   Depression    Family history of ovarian cancer    11/19 cancer genetic testing letter sent   Family history of pancreatic cancer    Family History  Problem Relation Age of Onset   COPD Mother        smoker   Ovarian cancer Mother    Emphysema Father        smoker   COPD Maternal Grandmother        smoker   Colon cancer Maternal Grandfather    Depression Paternal Grandfather        Shot himself; pt found him when she was 35yo   Breast cancer Maternal Aunt 30   Ovarian cancer Maternal Aunt    Pancreatic cancer Maternal Uncle    Past Surgical History:  Procedure Laterality Date   ABDOMINAL HYSTERECTOMY     CHOLECYSTECTOMY     COMBINED HYSTEROSCOPY DIAGNOSTIC / D&C     Several d/t multiple miscarriages   LAPAROSCOPIC BILATERAL SALPINGECTOMY N/A 02/11/2019   Procedure: LAPAROSCOPIC BILATERAL SALPINGECTOMY, POSSIBLE LEFT OVARIAN CYSTECTOMY;  Surgeon: Vena Austria, MD;  Location: [facility] ORS;  Service: Gynecology;  Laterality: N/A;   LAPAROSCOPY N/A 02/11/2019   Procedure: LAPAROSCOPY DIAGNOSTIC;  Surgeon: Vena Austria, MD;  Location: [facility] ORS;   Service: Gynecology;  Laterality: N/A;   TUBAL LIGATION     Patient Active Problem List   Diagnosis Date Noted   Constipation by delayed colonic transit 01/08/2019   Blood in stool 01/08/2019   Abnormal vaginal bleeding 06/06/2018   Trauma in childhood 06/06/2018      Prior to Admission medications   Medication Sig Start Date End Date Taking? Authorizing Provider  HYDROcodone-acetaminophen (NORCO) 5-325 MG tablet Take 1 tablet by mouth every 4 (four) hours as needed for moderate pain. 05/23/21  Yes Willy Eddy, MD  albuterol (PROVENTIL HFA;VENTOLIN HFA) 108 (90 Base) MCG/ACT inhaler Inhale into the lungs. 10/01/17   [provider]  benzonatate (TESSALON PERLES) 100 MG capsule Take 1 capsule (100 mg total) by mouth 3 (three) times daily as needed for cough. 09/29/20 09/29/21  Lucy Chris, PA  buPROPion (WELLBUTRIN XL) 150 MG 24 hr tablet Take 1 tablet (150 mg total) by mouth daily. 02/07/19   Doren Custard, FNP  busPIRone (BUSPAR) 7.5 MG tablet TAKE 1 TABLET BY MOUTH NIGHTLY. AFTER 7 DAYS, MAY INCREASE TO 1 TABLET BY MOUTH EVERY 8 HOURS AS NEEDED FOR ANXIETY (MAX 3 TABLETS PER DAY) 02/07/19   Doren Custard, FNP  HYDROcodone-acetaminophen (NORCO/VICODIN) 5-325 MG tablet Take 1 tablet by mouth every 4 (four)  hours as needed. 09/07/20   Ward, Layla Maw, DO  ibuprofen (ADVIL) 800 MG tablet Take 1 tablet (800 mg total) by mouth every 8 (eight) hours as needed for mild pain. 09/07/20   Ward, Layla Maw, DO  Insulin Pen Needle (NOVOFINE) 32G X 6 MM MISC 1 each by Does not apply route daily. 03/14/19   Doren Custard, FNP  Liraglutide -Weight Management (SAXENDA) 18 MG/3ML SOPN Inject 1.2 mg into the skin daily. 03/14/19   Doren Custard, FNP  Multiple Vitamin (MULTIVITAMIN) tablet Take 1 tablet by mouth daily.    [provider]  omeprazole (PRILOSEC) 20 MG capsule Take 1 capsule (20 mg total) by mouth daily for 30 days. 01/21/19 02/20/19  Pasty Spillers, MD  ondansetron  (ZOFRAN ODT) 4 MG disintegrating tablet Take 1 tablet (4 mg total) by mouth every 6 (six) hours as needed for nausea or vomiting. 09/07/20   Ward, Layla Maw, DO  ondansetron (ZOFRAN) 4 MG tablet Take 1 tablet (4 mg total) by mouth every 8 (eight) hours as needed for nausea or vomiting. 10/03/18   Doren Custard, FNP    Allergies Penicillins and Tramadol    Social History Social History   Tobacco Use   Smoking status: Never   Smokeless tobacco: Never  Vaping Use   Vaping Use: Never used  Substance Use Topics   Alcohol use: Never   Drug use: Never    Review of Systems Patient denies headaches, rhinorrhea, blurry vision, numbness, shortness of breath, chest pain, edema, cough, abdominal pain, nausea, vomiting, diarrhea, dysuria, fevers, rashes or hallucinations unless otherwise stated above in HPI. ____________________________________________   PHYSICAL EXAM:  VITAL SIGNS: Vitals:   05/23/21 1119  BP: 136/80  Pulse: 88  Resp: 18  Temp: 98.7 F (37.1 C)  SpO2: 99%    Constitutional: Alert and oriented.  Eyes: Conjunctivae are normal.  Head: Atraumatic. Nose: No congestion/rhinnorhea. Mouth/Throat: Mucous membranes are moist.   Neck: No stridor. Painless ROM.  Cardiovascular: Normal rate, regular rhythm. Grossly normal heart sounds.  Good peripheral circulation. Respiratory: Normal respiratory effort.  No retractions. Lungs CTAB. Gastrointestinal: Soft and nontender. No distention. No abdominal bruits. No CVA tenderness. Genitourinary:  Musculoskeletal: No lower extremity tenderness nor edema.  No joint effusions. Neurologic:  Normal speech and language. No gross focal neurologic deficits are appreciated. No facial droop Skin:  Skin is warm, dry and intact. No rash noted. Psychiatric: Mood and affect are normal. Speech and behavior are normal.  ____________________________________________   LABS (all labs ordered are listed, but only abnormal results are  displayed)  Results for orders placed or performed during the hospital encounter of 05/23/21 (from the past 24 hour(s))  Lipase, blood     Status: None   Collection Time: 05/23/21 10:20 AM  Result Value Ref Range   Lipase 40 11 - 51 U/L  Comprehensive metabolic panel     Status: Abnormal   Collection Time: 05/23/21 10:20 AM  Result Value Ref Range   Sodium 140 135 - 145 mmol/L   Potassium 4.0 3.5 - 5.1 mmol/L   Chloride 108 98 - 111 mmol/L   CO2 26 22 - 32 mmol/L   Glucose, Bld 88 70 - 99 mg/dL   BUN 16 6 - 20 mg/dL   Creatinine, Ser 4.91 0.44 - 1.00 mg/dL   Calcium 8.7 (L) 8.9 - 10.3 mg/dL   Total Protein 7.2 6.5 - 8.1 g/dL   Albumin 4.1 3.5 - 5.0 g/dL  AST 16 15 - 41 U/L   ALT 18 0 - 44 U/L   Alkaline Phosphatase 65 38 - 126 U/L   Total Bilirubin 0.6 0.3 - 1.2 mg/dL   GFR, Estimated >93 >79 mL/min   Anion gap 6 5 - 15  CBC     Status: Abnormal   Collection Time: 05/23/21 10:20 AM  Result Value Ref Range   WBC 7.1 4.0 - 10.5 K/uL   RBC 5.01 3.87 - 5.11 MIL/uL   Hemoglobin 15.3 (H) 12.0 - 15.0 g/dL   HCT 02.4 09.7 - 35.3 %   MCV 86.0 80.0 - 100.0 fL   MCH 30.5 26.0 - 34.0 pg   MCHC 35.5 30.0 - 36.0 g/dL   RDW 29.9 24.2 - 68.3 %   Platelets 327 150 - 400 K/uL   nRBC 0.0 0.0 - 0.2 %  Urinalysis, Routine w reflex microscopic Urine, Clean Catch     Status: Abnormal   Collection Time: 05/23/21 12:38 PM  Result Value Ref Range   Color, Urine YELLOW (A) YELLOW   APPearance HAZY (A) CLEAR   Specific Gravity, Urine 1.025 1.005 - 1.030   pH 5.0 5.0 - 8.0   Glucose, UA NEGATIVE NEGATIVE mg/dL   Hgb urine dipstick NEGATIVE NEGATIVE   Bilirubin Urine NEGATIVE NEGATIVE   Ketones, ur 5 (A) NEGATIVE mg/dL   Protein, ur NEGATIVE NEGATIVE mg/dL   Nitrite NEGATIVE NEGATIVE   Leukocytes,Ua NEGATIVE NEGATIVE  POC urine preg, ED     Status: None   Collection Time: 05/23/21 12:45 PM  Result Value Ref Range   Preg Test, Ur Negative Negative    ____________________________________________  EKG My review and personal interpretation at Time: 10:31   Indication: abd pain  Rate: 90  Rhythm: sinus Axis: normal Other: normal intervals, no stemi ____________________________________________  RADIOLOGY  I personally reviewed all radiographic images ordered to evaluate for the above acute complaints and reviewed radiology reports and findings.  These findings were personally discussed with the patient.  Please see medical record for radiology report.  ____________________________________________   PROCEDURES  Procedure(s) performed:  Procedures    Critical Care performed: no ____________________________________________   INITIAL IMPRESSION / ASSESSMENT AND PLAN / ED COURSE  Pertinent labs & imaging results that were available during my care of the patient were reviewed by me and considered in my medical decision making (see chart for details).   DDX: cyst, torsion, mass, stone, msk strain, abscess, pyelo  Sharon Andrade is a 35 y.o. who presents to the ED with symptoms as described above.  Patient nontoxic-appearing with above listed complaint.  Exam is reassuring.  Buttock reassuring.  Ultrasound be ordered for blood differential will provide pain medication symptomatic management.  Clinical Course as of 05/23/21 1430  Mon May 23, 2021  1428 Patient reassessed.  Feels well.  Ultrasound is reassuring.  Work-up here in the ER is reassuring.  This point do believe she stable appropriate for outpatient follow-up with OB.  Patient agreeable to plan.  No additional questions or concerns. [PR]    Clinical Course User Index [PR] Willy Eddy, MD    The patient was evaluated in Emergency Department today for the symptoms described in the history of present illness. He/she was evaluated in the context of the global COVID-19 pandemic, which necessitated consideration that the patient might be at risk for infection with  the SARS-CoV-2 virus that causes COVID-19. Institutional protocols and algorithms that pertain to the evaluation of patients at risk for  COVID-19 are in a state of rapid change based on information released by regulatory bodies including the CDC and federal and state organizations. These policies and algorithms were followed during the patient's care in the ED.  As part of my medical decision making, I reviewed the following data within the electronic MEDICAL RECORD NUMBER Nursing notes reviewed and incorporated, Labs reviewed, notes from prior ED visits and [place] Controlled Substance Database   ____________________________________________   FINAL CLINICAL IMPRESSION(S) / ED DIAGNOSES  Final diagnoses:  Pelvic pain      NEW MEDICATIONS STARTED DURING THIS VISIT:  New Prescriptions   HYDROCODONE-ACETAMINOPHEN (NORCO) 5-325 MG TABLET    Take 1 tablet by mouth every 4 (four) hours as needed for moderate pain.     Note:  This document was prepared using Dragon voice recognition software and may include unintentional dictation errors.    Willy Eddy, MD 05/23/21 1430

## 2021-05-23 NOTE — ED Triage Notes (Signed)
Pt come with c/o left sided pain. Pt stats some diarrhea. Pt denies any N/V. Pt denies any urinary symptoms. Pt states some dizzy spells over weaknend.

## 2021-10-08 ENCOUNTER — Emergency Department
Admission: EM | Admit: 2021-10-08 | Discharge: 2021-10-08 | Disposition: A | Discharge status: Home / Self Care | Payer: Self-pay | Attending: Emergency Medicine | Admitting: Emergency Medicine

## 2021-10-08 ENCOUNTER — Encounter: Payer: Self-pay | Admitting: Emergency Medicine

## 2021-10-08 ENCOUNTER — Other Ambulatory Visit: Payer: Self-pay

## 2021-10-08 ENCOUNTER — Emergency Department: Payer: Self-pay

## 2021-10-08 DIAGNOSIS — D72829 Elevated white blood cell count, unspecified: Secondary | ICD-10-CM | POA: Insufficient documentation

## 2021-10-08 DIAGNOSIS — R1011 Right upper quadrant pain: Secondary | ICD-10-CM | POA: Insufficient documentation

## 2021-10-08 DIAGNOSIS — R1110 Vomiting, unspecified: Secondary | ICD-10-CM | POA: Insufficient documentation

## 2021-10-08 DIAGNOSIS — R109 Unspecified abdominal pain: Secondary | ICD-10-CM

## 2021-10-08 LAB — CBC
HCT: 45.4 % (ref 36.0–46.0)
Hemoglobin: 15.3 g/dL — ABNORMAL HIGH (ref 12.0–15.0)
MCH: 28.9 pg (ref 26.0–34.0)
MCHC: 33.7 g/dL (ref 30.0–36.0)
MCV: 85.7 fL (ref 80.0–100.0)
Platelets: 397 10*3/uL (ref 150–400)
RBC: 5.3 MIL/uL — ABNORMAL HIGH (ref 3.87–5.11)
RDW: 12.9 % (ref 11.5–15.5)
WBC: 11.2 10*3/uL — ABNORMAL HIGH (ref 4.0–10.5)
nRBC: 0 % (ref 0.0–0.2)

## 2021-10-08 LAB — URINALYSIS, ROUTINE W REFLEX MICROSCOPIC
Bilirubin Urine: NEGATIVE
Glucose, UA: NEGATIVE mg/dL
Hgb urine dipstick: NEGATIVE
Ketones, ur: NEGATIVE mg/dL
Leukocytes,Ua: NEGATIVE
Nitrite: NEGATIVE
Protein, ur: NEGATIVE mg/dL
Specific Gravity, Urine: 1.024 (ref 1.005–1.030)
pH: 7 (ref 5.0–8.0)

## 2021-10-08 LAB — BASIC METABOLIC PANEL
Anion gap: 8 (ref 5–15)
BUN: 14 mg/dL (ref 6–20)
CO2: 25 mmol/L (ref 22–32)
Calcium: 9 mg/dL (ref 8.9–10.3)
Chloride: 105 mmol/L (ref 98–111)
Creatinine, Ser: 0.88 mg/dL (ref 0.44–1.00)
GFR, Estimated: >60 mL/min (ref >60)
Glucose, Bld: 85 mg/dL (ref 70–99)
Potassium: 4 mmol/L (ref 3.5–5.1)
Sodium: 138 mmol/L (ref 135–145)

## 2021-10-08 LAB — LIPASE, BLOOD: Lipase: 36 U/L (ref 11–51)

## 2021-10-08 MED ORDER — IOHEXOL 300 MG/ML  SOLN
100.0000 mL | Freq: Once | INTRAMUSCULAR | Status: AC | PRN
  Administered 2021-10-08: 100 mL via INTRAVENOUS
  Filled 2021-10-08: qty 100

## 2021-10-08 MED ORDER — ONDANSETRON HCL 4 MG/2ML IJ SOLN
4.0000 mg | Freq: Once | INTRAMUSCULAR | Status: AC
  Administered 2021-10-08: 4 mg via INTRAVENOUS
  Filled 2021-10-08: qty 2

## 2021-10-08 NOTE — ED Notes (Signed)
Pt with emesis and right flank pain x 2 days. Pt alert, NAD at this time, pt states pain has decreased and nausea has resolved.  ?

## 2021-10-08 NOTE — ED Provider Notes (Signed)
? ?[facility] ?Provider Note ? ?Patient Contact: 4:41 PM (approximate) ? ? ?History  ? ?Emesis and Flank Pain ? ? ?HPI ? ?Sharon Andrade is a 36 y.o. female with a history of hysterectomy, cholecystectomy and bilateral salpingectomy presents to the emergency department with right mid abdominal pain and vomiting for the past 2 days.  Patient denies history of pyelonephritis or nephrolithiasis.  States that she has had no dysuria, hematuria or increased urinary frequency.  She states that she has struggled with functional constipation since having hysterectomy.  No fever or chills at home.  No chest pain, chest tightness or shortness of breath. ? ?  ? ? ?Physical Exam  ? ?Triage Vital Signs: ?ED Triage Vitals  ?Enc Vitals Group  ?   BP 10/08/21 1502 (!) 135/92  ?   Pulse Rate 10/08/21 1502 88  ?   Resp 10/08/21 1502 16  ?   Temp 10/08/21 1502 98.2 ?F (36.8 ?C)  ?   Temp Source 10/08/21 1502 Oral  ?   SpO2 10/08/21 1502 96 %  ?   Weight 10/08/21 1500 176 lb 5.9 oz (80 kg)  ?   Height 10/08/21 1500 5\' 7"  (1.702 m)  ?   Head Circumference --   ?   Peak Flow --   ?   Pain Score 10/08/21 1500 8  ?   Pain Loc --   ?   Pain Edu? --   ?   Excl. in GC? --   ? ? ?Most recent vital signs: ?Vitals:  ? 10/08/21 1502 10/08/21 1753  ?BP: (!) 135/92 125/71  ?Pulse: 88 82  ?Resp: 16 18  ?Temp: 98.2 ?F (36.8 ?C)   ?SpO2: 96% 98%  ? ? ? ?General: Alert and in no acute distress. ?Eyes:  PERRL. EOMI. ?Head: No acute traumatic findings ?ENT: ?     Nose: No congestion/rhinnorhea. ?     Mouth/Throat: Mucous membranes are moist. ?Neck: No stridor. No cervical spine tenderness to palpation. ?Cardiovascular:  Good peripheral perfusion ?Respiratory: Normal respiratory effort without tachypnea or retractions. Lungs CTAB. Good air entry to the bases with no decreased or absent breath sounds. ?Gastrointestinal: Patient has right mid abdominal tenderness with guarding. ?Musculoskeletal: Full range of motion to all  extremities.  ?Neurologic:  No gross focal neurologic deficits are appreciated.  ?Skin:   No rash noted ?Other: ? ? ?ED Results / Procedures / Treatments  ? ?Labs ?(all labs ordered are listed, but only abnormal results are displayed) ?Labs Reviewed  ?URINALYSIS, ROUTINE W REFLEX MICROSCOPIC - Abnormal; Notable for the following components:  ?    Result Value  ? Color, Urine YELLOW (*)   ? APPearance CLOUDY (*)   ? All other components within normal limits  ?CBC - Abnormal; Notable for the following components:  ? WBC 11.2 (*)   ? RBC 5.30 (*)   ? Hemoglobin 15.3 (*)   ? All other components within normal limits  ?BASIC METABOLIC PANEL  ?LIPASE, BLOOD  ? ? ? ?RADIOLOGY ? ?I personally viewed and evaluated these images as part of my medical decision making, as well as reviewing the written report by the radiologist. ? ?ED Provider Interpretation: I personally reviewed CT of the abdomen and pelvis and there were no acute abnormalities visualized. ? ? ?PROCEDURES: ? ?Critical Care performed: No ? ?Procedures ? ? ?MEDICATIONS ORDERED IN ED: ?Medications  ?ondansetron (ZOFRAN) injection 4 mg (4 mg Intravenous Given 10/08/21 1653)  ?iohexol (OMNIPAQUE) 300 MG/ML  solution 100 mL (100 mLs Intravenous Contrast Given 10/08/21 1735)  ? ? ? ?IMPRESSION / MDM / ASSESSMENT AND PLAN / ED COURSE  ?I reviewed the triage vital signs and the nursing notes. ?             ?               ? ?Differential diagnosis includes, but is not limited to, appendicitis, constipation, small bowel obstruction, nephrolithiasis... ? ?Assessment and plan ?Abdominal pain ?36 year old female presents to the emergency department with right mid abdominal pain with nausea and vomiting for the past 2 days. ? ?Vital signs are reassuring at triage.  On physical exam, patient was alert, active and nontoxic-appearing.  Patient did have right mid abdominal tenderness with guarding. ? ?We will obtain CT abdomen and pelvis and will reassess. ? ?CT abdomen pelvis  reassuring without acute abnormality.  I personally reviewed CT. ? ?Patient had mild elevation in white blood cell count on CBC was otherwise reassuring.  BMP within reference range.  Urinalysis shows no signs of UTI. ? ? ?FINAL CLINICAL IMPRESSION(S) / ED DIAGNOSES  ? ?Final diagnoses:  ?Abdominal pain, unspecified abdominal location  ? ? ? ?Rx / DC Orders  ? ?ED Discharge Orders   ? ? None  ? ?  ? ? ? ?Note:  This document was prepared using Dragon voice recognition software and may include unintentional dictation errors. ?  ?Orvil Feil, PA-C ?10/08/21 1945 ? ?  ?Gilles Chiquito, MD ?10/08/21 1957 ? ?

## 2021-10-08 NOTE — Discharge Instructions (Signed)
Please increase your intake of soluble fiber such as apples and oatmeal. ?Please drink 64 ounces of water a day. ?Please try to walk a mile a day. ?

## 2021-10-08 NOTE — ED Triage Notes (Signed)
Pt reports right flank pain and vomiting for the past 2 days. Pt denies urinary sx's, sob, or other complaints.  ?

## 2022-12-20 ENCOUNTER — Other Ambulatory Visit: Payer: Self-pay

## 2022-12-20 DIAGNOSIS — K920 Hematemesis: Secondary | ICD-10-CM | POA: Insufficient documentation

## 2022-12-20 DIAGNOSIS — R110 Nausea: Secondary | ICD-10-CM | POA: Diagnosis present

## 2022-12-20 LAB — CBC WITH DIFFERENTIAL/PLATELET
Abs Immature Granulocytes: 0.04 10*3/uL (ref 0.00–0.07)
Basophils Absolute: 0.1 10*3/uL (ref 0.0–0.1)
Basophils Relative: 1 %
Eosinophils Absolute: 0.7 10*3/uL — ABNORMAL HIGH (ref 0.0–0.5)
Eosinophils Relative: 8 %
HCT: 40.9 % (ref 36.0–46.0)
Hemoglobin: 13.9 g/dL (ref 12.0–15.0)
Immature Granulocytes: 0 %
Lymphocytes Relative: 34 %
Lymphs Abs: 3.1 10*3/uL (ref 0.7–4.0)
MCH: 29.2 pg (ref 26.0–34.0)
MCHC: 34 g/dL (ref 30.0–36.0)
MCV: 85.9 fL (ref 80.0–100.0)
Monocytes Absolute: 0.7 10*3/uL (ref 0.1–1.0)
Monocytes Relative: 8 %
Neutro Abs: 4.5 10*3/uL (ref 1.7–7.7)
Neutrophils Relative %: 49 %
Platelets: 356 10*3/uL (ref 150–400)
RBC: 4.76 MIL/uL (ref 3.87–5.11)
RDW: 12.7 % (ref 11.5–15.5)
WBC: 9.1 10*3/uL (ref 4.0–10.5)
nRBC: 0 % (ref 0.0–0.2)

## 2022-12-20 LAB — COMPREHENSIVE METABOLIC PANEL
ALT: 25 U/L (ref 0–44)
AST: 19 U/L (ref 15–41)
Albumin: 4.2 g/dL (ref 3.5–5.0)
Alkaline Phosphatase: 79 U/L (ref 38–126)
Anion gap: 9 (ref 5–15)
BUN: 23 mg/dL — ABNORMAL HIGH (ref 6–20)
CO2: 26 mmol/L (ref 22–32)
Calcium: 9.1 mg/dL (ref 8.9–10.3)
Chloride: 105 mmol/L (ref 98–111)
Creatinine, Ser: 0.89 mg/dL (ref 0.44–1.00)
GFR, Estimated: >60 mL/min (ref >60)
Glucose, Bld: 96 mg/dL (ref 70–99)
Potassium: 4.1 mmol/L (ref 3.5–5.1)
Sodium: 140 mmol/L (ref 135–145)
Total Bilirubin: 0.6 mg/dL (ref 0.3–1.2)
Total Protein: 7.3 g/dL (ref 6.5–8.1)

## 2022-12-20 LAB — LIPASE, BLOOD: Lipase: 48 U/L (ref 11–51)

## 2022-12-20 LAB — TROPONIN I (HIGH SENSITIVITY): Troponin I (High Sensitivity): 3 ng/L (ref <18)

## 2022-12-20 NOTE — ED Triage Notes (Signed)
Pt presents to ER from home with c/o hematemesis that happened around 30 minutes ago.  Pt states tonight, after eating some food.  Pt states she first vomited up some food that she had eaten, and then noticed started to throw up more, and saw bright red blood clots in the emesis.  Pt denies any hx of this happening previously.  Pt also endorses some central/epigastric chest pain that started after vomiting.  Pt has hx of cholecystectomy in past.  Pt is otherwise A&O x4 and in NAD in triage.

## 2022-12-21 ENCOUNTER — Emergency Department
Admission: EM | Admit: 2022-12-21 | Discharge: 2022-12-21 | Disposition: A | Discharge status: Home / Self Care | Payer: BC Managed Care – PPO | Attending: Emergency Medicine | Admitting: Emergency Medicine

## 2022-12-21 DIAGNOSIS — K920 Hematemesis: Secondary | ICD-10-CM

## 2022-12-21 LAB — TYPE AND SCREEN
ABO/RH(D): B POS
Antibody Screen: NEGATIVE

## 2022-12-21 MED ORDER — ALUM & MAG HYDROXIDE-SIMETH 200-200-20 MG/5ML PO SUSP
30.0000 mL | Freq: Once | ORAL | Status: AC
  Administered 2022-12-21: 30 mL via ORAL
  Filled 2022-12-21: qty 30

## 2022-12-21 MED ORDER — SUCRALFATE 1 G PO TABS
1.0000 g | ORAL_TABLET | Freq: Once | ORAL | Status: AC
Start: 2022-12-21 — End: 2022-12-21
  Administered 2022-12-21: 1 g via ORAL
  Filled 2022-12-21: qty 1

## 2022-12-21 MED ORDER — ONDANSETRON 4 MG PO TBDP: 4.0000 mg | ORAL_TABLET | Freq: Three times a day (TID) | ORAL | 0 refills | Status: AC | PRN

## 2022-12-21 MED ORDER — FAMOTIDINE 20 MG PO TABS: 20.0000 mg | ORAL_TABLET | Freq: Every day | ORAL | 1 refills | Status: AC

## 2022-12-21 MED ORDER — FAMOTIDINE 20 MG PO TABS
20.0000 mg | ORAL_TABLET | Freq: Once | ORAL | Status: AC
  Administered 2022-12-21: 20 mg via ORAL
  Filled 2022-12-21: qty 1

## 2022-12-21 MED ORDER — ONDANSETRON 4 MG PO TBDP
4.0000 mg | ORAL_TABLET | Freq: Once | ORAL | Status: AC
  Administered 2022-12-21: 4 mg via ORAL
  Filled 2022-12-21: qty 1

## 2022-12-21 NOTE — Discharge Instructions (Addendum)
Stop taking the meloxicam. Stop all NSAIDs (aspirin, BC/Goody's, ibuprofen/Advil)  Use Tylenol for pain and fevers.  Up to 1000 mg per dose, up to 4 times per day.  Do not take more than 4000 mg of Tylenol/acetaminophen within 24 hours..  Tylenol does not have the same effect on the stomach and is safe to use every day for your foot  Take the Pepcid once per day every day to help reduce acid in the stomach  Zofran as needed for any further nausea or vomiting  Follow-up with the GI doctor in the clinic to discuss endoscopy  If your symptoms worsen in the meantime then please return to the ED

## 2022-12-21 NOTE — ED Provider Notes (Signed)
[facility] Provider Note    Event Date/Time   First MD Initiated Contact with Patient 12/21/22 0050     (approximate)   History   Hematemesis   HPI  Sharon Andrade is a 37 y.o. female who presents to the ED for evaluation of Hematemesis   I review a PCP visit from 5/8 where she was evaluated for plantar pain, diagnosed with plantar fasciitis and prescribed meloxicam.  Patient reports developing nausea, acid taste or sensation in the mouth and emesis this evening a few hours after dinner.  No immediate postprandial pain or nausea.  She reports vomiting of all of her dinner and towards the end had a couple small clots and some bright red blood.  No other bleeding diatheses.  No other episodes beyond the 1 episode of hematemesis this evening.  She reports feeling better now but somewhat nauseous.  No abdominal pain.  No fevers or recent illnesses otherwise.  She is s/p cholecystectomy remotely   Physical Exam   Triage Vital Signs: ED Triage Vitals [12/20/22 2305]  Enc Vitals Group     BP (!) 142/99     Pulse Rate 88     Resp 20     Temp 98 F (36.7 C)     Temp Source Oral     SpO2 94 %     Weight 213 lb (96.6 kg)     Height 5\' 7"  (1.702 m)     Head Circumference      Peak Flow      Pain Score 6     Pain Loc      Pain Edu?      Excl. in GC?     Most recent vital signs: Vitals:   12/20/22 2305 12/21/22 0255  BP: (!) 142/99 (!) 144/85  Pulse: 88 81  Resp: 20 18  Temp: 98 F (36.7 C) 98 F (36.7 C)  SpO2: 94% 96%    General: Awake, no distress.  Obese, pleasant conversational.  Well-appearing. CV:  Good peripheral perfusion.  Resp:  Normal effort.  Abd:  No distention.  Soft and benign throughout MSK:  No deformity noted.  Neuro:  No focal deficits appreciated. Other:     ED Results / Procedures / Treatments   Labs (all labs ordered are listed, but only abnormal results are displayed) Labs Reviewed  CBC WITH  DIFFERENTIAL/PLATELET - Abnormal; Notable for the following components:      Result Value   Eosinophils Absolute 0.7 (*)    All other components within normal limits  COMPREHENSIVE METABOLIC PANEL - Abnormal; Notable for the following components:   BUN 23 (*)    All other components within normal limits  LIPASE, BLOOD  TYPE AND SCREEN  TROPONIN I (HIGH SENSITIVITY)  TROPONIN I (HIGH SENSITIVITY)    EKG Sinus rhythm at a rate of 80 bpm.  Rightward axis and normal intervals.  No clear signs of acute ischemia.  RADIOLOGY   Official radiology report(s): No results found.  PROCEDURES and INTERVENTIONS:  Procedures  Medications  ondansetron (ZOFRAN-ODT) disintegrating tablet 4 mg (4 mg Oral Given 12/21/22 0241)  famotidine (PEPCID) tablet 20 mg (20 mg Oral Given 12/21/22 0241)  alum & mag hydroxide-simeth (MAALOX/MYLANTA) 200-200-20 MG/5ML suspension 30 mL (30 mLs Oral Given 12/21/22 0241)  sucralfate (CARAFATE) tablet 1 g (1 g Oral Given 12/21/22 0241)     IMPRESSION / MDM / ASSESSMENT AND PLAN / ED COURSE  I reviewed the triage vital  signs and the nursing notes.  Differential diagnosis includes, but is not limited to, pancreatitis, biliary colic, Mallory-Weiss tear, gastric ulcers, GERD, medication side effect  {Patient presents with symptoms of an acute illness or injury that is potentially life-threatening.  -year-old woman who was recently started on NSAIDs for planta fasciitis presents with an episode of hematemesis that is likely due to gastric ulcers and suitable for trial of outpatient management close GI follow-up and starting H2 blockers.  Her hemoglobin is stable, her vitals are stable and she looks well.  No abdominal pain or tenderness.  Hemoglobin is normal.  Metabolic panel is essentially normal.  Lipase and troponin are normal.  EKG is nonischemic.  She is no recurrent episodes her symptoms are controlled.  I considered observation admission for this patient, but I  believe a trial of outpatient management is reasonable.  We discussed cessation of all NSAIDs and other blood thinners, starting H2 blocker and a provided referral information for GI.  We discussed return precautions.      FINAL CLINICAL IMPRESSION(S) / ED DIAGNOSES   Final diagnoses:  Hematemesis with nausea     Rx / DC Orders   ED Discharge Orders          Ordered    ondansetron (ZOFRAN-ODT) 4 MG disintegrating tablet  Every 8 hours PRN        12/21/22 0233    famotidine (PEPCID) 20 MG tablet  Daily        12/21/22 0233             Note:  This document was prepared using Dragon voice recognition software and may include unintentional dictation errors.   Delton Prairie, MD 12/21/22 [phone]
